# Patient Record
Sex: Female | Born: 1990
Health system: Southern US, Community
[De-identification: ages and names within clinical notes are randomized; demographics above are authoritative.]

## PROBLEM LIST (undated history)

## (undated) ENCOUNTER — Inpatient Hospital Stay (HOSPITAL_COMMUNITY): Payer: Self-pay

## (undated) DIAGNOSIS — O1420 HELLP syndrome (HELLP), unspecified trimester: Secondary | ICD-10-CM

## (undated) DIAGNOSIS — R51 Headache: Secondary | ICD-10-CM

## (undated) DIAGNOSIS — Q512 Other doubling of uterus, unspecified: Secondary | ICD-10-CM

## (undated) DIAGNOSIS — J45909 Unspecified asthma, uncomplicated: Secondary | ICD-10-CM

## (undated) DIAGNOSIS — Q5128 Other doubling of uterus, other specified: Secondary | ICD-10-CM

## (undated) HISTORY — DX: Unspecified asthma, uncomplicated: J45.909

## (undated) HISTORY — DX: Headache: R51

## (undated) HISTORY — PX: CYST EXCISION: SHX5701

---
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
## 2011-07-10 ENCOUNTER — Other Ambulatory Visit (HOSPITAL_COMMUNITY)
Admission: RE | Admit: 2011-07-10 | Discharge: 2011-07-10 | Disposition: A | Discharge status: Home / Self Care | Payer: BC Managed Care – PPO | Source: Ambulatory Visit | Attending: Family Medicine | Admitting: Family Medicine

## 2011-07-10 ENCOUNTER — Other Ambulatory Visit: Payer: Self-pay | Admitting: Dermatology

## 2011-07-10 DIAGNOSIS — Z124 Encounter for screening for malignant neoplasm of cervix: Secondary | ICD-10-CM | POA: Insufficient documentation

## 2013-06-14 ENCOUNTER — Other Ambulatory Visit (HOSPITAL_COMMUNITY)
Admission: RE | Admit: 2013-06-14 | Discharge: 2013-06-14 | Disposition: A | Discharge status: Home / Self Care | Payer: BC Managed Care – PPO | Source: Ambulatory Visit | Attending: Family Medicine | Admitting: Family Medicine

## 2013-06-14 ENCOUNTER — Other Ambulatory Visit: Payer: Self-pay | Admitting: Family Medicine

## 2013-06-14 DIAGNOSIS — Z124 Encounter for screening for malignant neoplasm of cervix: Secondary | ICD-10-CM | POA: Insufficient documentation

## 2014-03-10 ENCOUNTER — Encounter: Payer: Self-pay | Admitting: *Deleted

## 2014-03-10 DIAGNOSIS — R51 Headache: Secondary | ICD-10-CM

## 2014-03-10 DIAGNOSIS — R519 Headache, unspecified: Secondary | ICD-10-CM | POA: Insufficient documentation

## 2014-03-16 ENCOUNTER — Encounter: Payer: Self-pay | Admitting: Neurology

## 2014-03-16 ENCOUNTER — Ambulatory Visit (INDEPENDENT_AMBULATORY_CARE_PROVIDER_SITE_OTHER): Payer: BC Managed Care – PPO | Admitting: Neurology

## 2014-03-16 VITALS — BP 102/68 | HR 68 | Temp 98.1°F | Resp 16 | Ht 64.0 in | Wt 133.9 lb

## 2014-03-16 DIAGNOSIS — R519 Headache, unspecified: Secondary | ICD-10-CM

## 2014-03-16 DIAGNOSIS — R51 Headache: Secondary | ICD-10-CM

## 2014-03-16 DIAGNOSIS — G44209 Tension-type headache, unspecified, not intractable: Secondary | ICD-10-CM

## 2014-03-16 DIAGNOSIS — G4485 Primary stabbing headache: Secondary | ICD-10-CM

## 2014-03-16 MED ORDER — SUMATRIPTAN SUCCINATE 100 MG PO TABS: ORAL_TABLET | ORAL | Status: DC

## 2014-03-16 MED ORDER — TOPIRAMATE 25 MG PO TABS: 25.0000 mg | ORAL_TABLET | Freq: Every day | ORAL | Status: DC

## 2014-03-16 NOTE — Progress Notes (Addendum)
NEUROLOGY CONSULTATION NOTE  Brianna Robinson MRN: [id number] DOB: Feb 07, 1991  Referring provider: Dr. Hyman HopesWebb Primary care provider: Dr. Hyman HopesWebb  Reason for consult:  New onset daily headaches.  HISTORY OF PRESENT ILLNESS: Brianna Robinson is a 23 year old right-handed woman with remote history of asthma who presents for headache.  Onset:  3 months ago (March) Location:  Bifrontal, temporal, and anterior top of the head. Quality:  Brief intense sharp stabbing pain in the temples. Throbbing headache in the by temporal, frontal, retro-orbital region and top of the head. Both these headaches can occur separately. She also has fairly chronic muscle tension in her neck and shoulders. Intensity:  5/10 Aura:  No Prodrome:  No Associated symptoms:  No nausea, vomiting, photophobia, phonophobia, osmophobia, visual disturbance, or autonomic symptoms. Duration:  Stabbing pain is brief. Throbbing headaches occur up to 10 hours. She can wake up with them, they can occur during the day, where they can occur in the evening before bed. Frequency:  Daily. Triggers/exacerbating factors:  She feels that her neck and shoulder tension contributes. Otherwise, she hasn't really noticed any other triggers. Relieving factors:  No noticeable relieving factors. Sometimes if she lays supine with her head hanging off the bed, it relieves the neck tension but not the headache. Activity:  She is able to force herself to function  Past abortive therapy:  Excedrin Migraine, Excedrin tension headache, naproxen 1000 mg Past preventative therapy:  None Recently discontinued norgestimate-Eth Estradiol  Current abortive therapy:  cyclobenzaprine 10mg  (only takes at bedtime to 2 concern for sedation. Since she falls asleep she is not sure if it's effective), sumatriptan 25mg  (tried a couple of times and was ineffective), naproxen 1000mg  (ineffective. May be takes 3 times per week) Current preventative therapy:  None  Caffeine:   1 cup of coffee per day Alcohol:  No Smoker:  no Diet:  Increase hydration. Actively trying to eat well Exercise:  Good Depression/stress:  Recently graduated from college in December. She does have some anxiety about finding another job but she says that she and her husband are financially secure at this time. Sleep hygiene:  Good Family history of headache:  Mom, maternal grandmother (both had migraines)  PAST MEDICAL HISTORY: Past Medical History  Diagnosis Date  . Asthma   . Headache(784.0)     PAST SURGICAL HISTORY: Past Surgical History  Procedure Laterality Date  . Cyst excision      MEDICATIONS: Current Outpatient Prescriptions on File Prior to Visit  Medication Sig Dispense Refill  . naproxen (NAPROSYN) 250 MG tablet Take by mouth 2 (two) times daily with a meal.      . norgestimate-ethinyl estradiol (ORTHO-CYCLEN,SPRINTEC,PREVIFEM) 0.25-35 MG-MCG tablet Take 1 tablet by mouth daily.      . miconazole (MICOTIN) 2 % cream Apply 1 application topically 2 (two) times daily.       No current facility-administered medications on file prior to visit.    ALLERGIES: Allergies  Allergen Reactions  . Amoxicillin     FAMILY HISTORY: Family History  Problem Relation Age of Onset  . Ataxia Neg Hx   . Chorea Neg Hx   . Dementia Neg Hx   . Mental retardation Neg Hx   . Migraines Neg Hx   . Multiple sclerosis Neg Hx   . Neurofibromatosis Neg Hx   . Neuropathy Neg Hx   . Parkinsonism Neg Hx   . Seizures Neg Hx   . Stroke Neg Hx     SOCIAL HISTORY: History  Social History  . Marital Status: Married    Spouse Name: N/A    Number of Children: N/A  . Years of Education: N/A   Occupational History  . Not on file.   Social History Main Topics  . Smoking status: Never Smoker   . Smokeless tobacco: Not on file  . Alcohol Use: Yes     Comment: very rare   . Drug Use: No  . Sexual Activity: Yes    Partners: Male   Other Topics Concern  . Not on file    Social History Narrative  . No narrative on file    REVIEW OF SYSTEMS: Constitutional: No fevers, chills, or sweats, no generalized fatigue, change in appetite Eyes: No visual changes, double vision, eye pain Ear, nose and throat: No hearing loss, ear pain, nasal congestion, sore throat Cardiovascular: No chest pain, palpitations Respiratory:  No shortness of breath at rest or with exertion, wheezes GastrointestinaI: No nausea, vomiting, diarrhea, abdominal pain, fecal incontinence Genitourinary:  No dysuria, urinary retention or frequency Musculoskeletal:  No neck pain, back pain Integumentary: No rash, pruritus, skin lesions Neurological: as above Psychiatric: No depression, insomnia, anxiety Endocrine: No palpitations, fatigue, diaphoresis, mood swings, change in appetite, change in weight, increased thirst Hematologic/Lymphatic:  No anemia, purpura, petechiae. Allergic/Immunologic: no itchy/runny eyes, nasal congestion, recent allergic reactions, rashes  PHYSICAL EXAM: Filed Vitals:   03/16/14 0914  BP: 102/68  Pulse: 68  Temp: 98.1 F (36.7 C)  Resp: 16   General: No acute distress Head:  Normocephalic/atraumatic Neck: supple, bilateral tenderness to palpation, as well as bilateral suboccipital tenderness to palpation, full range of motion Back: No paraspinal tenderness Heart: regular rate and rhythm Lungs: Clear to auscultation bilaterally. Vascular: No carotid bruits. Neurological Exam: Mental status: alert and oriented to person, place, and time, recent and remote memory intact, fund of knowledge intact, attention and concentration intact, speech fluent and not dysarthric, language intact. Cranial nerves: CN I: not tested CN II: pupils equal, round and reactive to light, visual fields intact, fundi unremarkable, without vessel changes, exudates, hemorrhages or papilledema. CN III, IV, VI:  full range of motion, no nystagmus, no ptosis CN V: facial sensation  intact CN VII: upper and lower face symmetric CN VIII: hearing intact CN IX, X: gag intact, uvula midline CN XI: sternocleidomastoid and trapezius muscles intact CN XII: tongue midline Bulk & Tone: normal, no fasciculations. Motor: 5 out of 5 throughout Sensation: Temperature and vibration intact Deep Tendon Reflexes: 2+ throughout, toes downgoing Finger to nose testing:  No dysmetria Heel to shin: No dysmetria Gait: Normal station and stride. Able to turn and walk in tandem. Romberg negative.  IMPRESSION: Possibly primary stabbing headache with tension-type headaches. Migraine is also a possibility. Her semiology doesn't really fit any particular headache syndrome.  PLAN: 1. Will initiate Topamax 25 mg at bedtime. Side effects discussed. 2. For abortive therapy, will try sumatriptan at 100 mg. If ineffective, she will try taking it with naproxen 500 mg. 3. Limit use of all pain relievers to no more than 2 days out of the week. 4. Since this is a new daily persistent headache, will order MRI of the brain with and without contrast to rule out any structural intracranial abnormalities. 5. She will call in 4 weeks with an update and we can adjust medications if needed. 6. Followup in 3 months.  Thank you for allowing me to take part in the care of this patient.  Shon Millet, DO  CC: Okey Regal  Hyman HopesWebb, MD

## 2014-03-16 NOTE — Patient Instructions (Addendum)
  1.  Take sumatriptan 100mg  at immediate onset of migraine.  May repeat once in 2 hours if needed.  Do not exceed 2 tablets in 24 hours.  If ineffective, next time you have a headache, take it with naproxen 500mg  2.  Limit use of pain relievers to no more than 2 days out of the week.  These medications include acetaminophen, ibuprofen, triptans and narcotics.  This will help reduce risk of rebound headaches. 3.  Keep a headache diary. 4.  Stay adequately hydrated. 5.  Maintain good sleep hygiene. 6.  Maintain proper stress management. 7.  Try a cyclobenzaprine during the day if you feel the neck tension. 8.  We will start topiramate (Topamax) 25mg  at bedtime.  Possible side effects include: impaired thinking, sedation, paresthesias (numbness and tingling) and weight loss.  It may cause dehydration and there is a small risk for kidney stones, so make sure to stay hydrated with water during the day.  There is also a very small risk for glaucoma, so if you notice any change in your vision while taking this medication, see an ophthalmologist.  9.  Call in 4 weeks with update and we can adjust dose if needed. 10.  Follow up in 3 months. 11.  We will also get MRI of the brain since this is a new type of headache. [facility]  04/07/14 at 1:45pm

## 2014-04-07 ENCOUNTER — Ambulatory Visit (HOSPITAL_COMMUNITY): Payer: BC Managed Care – PPO

## 2014-04-26 ENCOUNTER — Telehealth: Payer: Self-pay | Admitting: Neurology

## 2014-04-26 NOTE — Telephone Encounter (Signed)
Returned patient's call. She states that when she saw Dr. Everlena CooperJaffe in June that they discussed possibly trying Biofeedback down the road for her. She states that she thinks she wants to go ahead and give it a try, but wanted to know what she needed to tell her insurance co in order to find out if they would cover it and how much they would cover. I advised her to give them a call and see if they cover Biofeedback Therapy and if they do what percentage do they cover or what her out of pocket would be. She states she would do that. She will get back to us if she wants to proceed.

## 2014-04-26 NOTE — Telephone Encounter (Signed)
Pt called requesting to speak to a nurse. C.B [phone]

## 2014-06-20 ENCOUNTER — Ambulatory Visit: Payer: BC Managed Care – PPO | Admitting: Neurology

## 2015-06-05 LAB — OB RESULTS CONSOLE RPR: RPR: NONREACTIVE

## 2015-06-05 LAB — OB RESULTS CONSOLE HEPATITIS B SURFACE ANTIGEN: Hepatitis B Surface Ag: NEGATIVE

## 2015-06-05 LAB — OB RESULTS CONSOLE RUBELLA ANTIBODY, IGM: RUBELLA: IMMUNE

## 2015-06-05 LAB — OB RESULTS CONSOLE ANTIBODY SCREEN: Antibody Screen: NEGATIVE

## 2015-06-05 LAB — OB RESULTS CONSOLE ABO/RH: RH Type: NEGATIVE

## 2015-06-05 LAB — OB RESULTS CONSOLE HIV ANTIBODY (ROUTINE TESTING): HIV: NONREACTIVE

## 2015-06-20 LAB — OB RESULTS CONSOLE GC/CHLAMYDIA
CHLAMYDIA, DNA PROBE: NEGATIVE
GC PROBE AMP, GENITAL: NEGATIVE

## 2015-12-21 LAB — OB RESULTS CONSOLE GBS: GBS: NEGATIVE

## 2015-12-27 ENCOUNTER — Inpatient Hospital Stay (HOSPITAL_COMMUNITY): Payer: BLUE CROSS/BLUE SHIELD

## 2015-12-27 ENCOUNTER — Inpatient Hospital Stay (HOSPITAL_COMMUNITY)
Admission: AD | Admit: 2015-12-27 | Discharge: 2015-12-31 | DRG: 765 | Disposition: A | Discharge status: Home / Self Care | Payer: BLUE CROSS/BLUE SHIELD | Source: Ambulatory Visit | Attending: Obstetrics and Gynecology | Admitting: Obstetrics and Gynecology

## 2015-12-27 ENCOUNTER — Encounter (HOSPITAL_COMMUNITY): Payer: Self-pay | Admitting: *Deleted

## 2015-12-27 DIAGNOSIS — Z3A37 37 weeks gestation of pregnancy: Secondary | ICD-10-CM

## 2015-12-27 DIAGNOSIS — O3403 Maternal care for unspecified congenital malformation of uterus, third trimester: Secondary | ICD-10-CM | POA: Diagnosis present

## 2015-12-27 DIAGNOSIS — D62 Acute posthemorrhagic anemia: Secondary | ICD-10-CM | POA: Diagnosis not present

## 2015-12-27 DIAGNOSIS — O9952 Diseases of the respiratory system complicating childbirth: Secondary | ICD-10-CM | POA: Diagnosis present

## 2015-12-27 DIAGNOSIS — K219 Gastro-esophageal reflux disease without esophagitis: Secondary | ICD-10-CM | POA: Diagnosis present

## 2015-12-27 DIAGNOSIS — J45909 Unspecified asthma, uncomplicated: Secondary | ICD-10-CM | POA: Diagnosis present

## 2015-12-27 DIAGNOSIS — O9081 Anemia of the puerperium: Secondary | ICD-10-CM | POA: Diagnosis not present

## 2015-12-27 DIAGNOSIS — R109 Unspecified abdominal pain: Secondary | ICD-10-CM

## 2015-12-27 DIAGNOSIS — O26899 Other specified pregnancy related conditions, unspecified trimester: Secondary | ICD-10-CM

## 2015-12-27 DIAGNOSIS — Q512 Other doubling of uterus: Secondary | ICD-10-CM | POA: Diagnosis not present

## 2015-12-27 DIAGNOSIS — O1424 HELLP syndrome, complicating childbirth: Principal | ICD-10-CM | POA: Diagnosis present

## 2015-12-27 DIAGNOSIS — O9962 Diseases of the digestive system complicating childbirth: Secondary | ICD-10-CM | POA: Diagnosis present

## 2015-12-27 DIAGNOSIS — D509 Iron deficiency anemia, unspecified: Secondary | ICD-10-CM | POA: Diagnosis present

## 2015-12-27 DIAGNOSIS — R1013 Epigastric pain: Secondary | ICD-10-CM | POA: Diagnosis present

## 2015-12-27 HISTORY — DX: Other doubling of uterus, unspecified: Q51.20

## 2015-12-27 HISTORY — DX: Other and unspecified doubling of uterus: Q51.28

## 2015-12-27 LAB — CBC
HCT: 31.5 % — ABNORMAL LOW (ref 36.0–46.0)
Hemoglobin: 10.9 g/dL — ABNORMAL LOW (ref 12.0–15.0)
MCH: 30.6 pg (ref 26.0–34.0)
MCHC: 34.6 g/dL (ref 30.0–36.0)
MCV: 88.5 fL (ref 78.0–100.0)
Platelets: 137 10*3/uL — ABNORMAL LOW (ref 150–400)
RBC: 3.56 MIL/uL — ABNORMAL LOW (ref 3.87–5.11)
RDW: 13.1 % (ref 11.5–15.5)
WBC: 13.9 10*3/uL — ABNORMAL HIGH (ref 4.0–10.5)

## 2015-12-27 LAB — CBC WITH DIFFERENTIAL/PLATELET
Basophils Absolute: 0 10*3/uL (ref 0.0–0.1)
Basophils Relative: 0 %
Eosinophils Absolute: 0.1 10*3/uL (ref 0.0–0.7)
Eosinophils Relative: 1 %
HCT: 31.7 % — ABNORMAL LOW (ref 36.0–46.0)
Hemoglobin: 11 g/dL — ABNORMAL LOW (ref 12.0–15.0)
Lymphocytes Relative: 15 %
Lymphs Abs: 1.8 10*3/uL (ref 0.7–4.0)
MCH: 30.7 pg (ref 26.0–34.0)
MCHC: 34.7 g/dL (ref 30.0–36.0)
MCV: 88.5 fL (ref 78.0–100.0)
Monocytes Absolute: 0.5 10*3/uL (ref 0.1–1.0)
Monocytes Relative: 4 %
Neutro Abs: 9.8 10*3/uL — ABNORMAL HIGH (ref 1.7–7.7)
Neutrophils Relative %: 80 %
Platelets: 166 10*3/uL (ref 150–400)
RBC: 3.58 MIL/uL — ABNORMAL LOW (ref 3.87–5.11)
RDW: 13.1 % (ref 11.5–15.5)
WBC: 12.2 10*3/uL — ABNORMAL HIGH (ref 4.0–10.5)

## 2015-12-27 LAB — PROTEIN / CREATININE RATIO, URINE
Creatinine, Urine: 32 mg/dL
Total Protein, Urine: <6 mg/dL

## 2015-12-27 LAB — AST: AST: 63 U/L — ABNORMAL HIGH (ref 15–41)

## 2015-12-27 LAB — COMPREHENSIVE METABOLIC PANEL
ALT: 32 U/L (ref 14–54)
AST: 44 U/L — ABNORMAL HIGH (ref 15–41)
Albumin: 2.9 g/dL — ABNORMAL LOW (ref 3.5–5.0)
Alkaline Phosphatase: 117 U/L (ref 38–126)
Anion gap: 8 (ref 5–15)
BUN: 10 mg/dL (ref 6–20)
CO2: 23 mmol/L (ref 22–32)
Calcium: 8.7 mg/dL — ABNORMAL LOW (ref 8.9–10.3)
Chloride: 103 mmol/L (ref 101–111)
Creatinine, Ser: 0.62 mg/dL (ref 0.44–1.00)
GFR calc Af Amer: >60 mL/min (ref >60)
GFR calc non Af Amer: >60 mL/min (ref >60)
Glucose, Bld: 105 mg/dL — ABNORMAL HIGH (ref 65–99)
Potassium: 3.9 mmol/L (ref 3.5–5.1)
Sodium: 134 mmol/L — ABNORMAL LOW (ref 135–145)
Total Bilirubin: 0.5 mg/dL (ref 0.3–1.2)
Total Protein: 6.1 g/dL — ABNORMAL LOW (ref 6.5–8.1)

## 2015-12-27 LAB — AMYLASE: Amylase: 57 U/L (ref 28–100)

## 2015-12-27 LAB — PLATELET COUNT: Platelets: 147 10*3/uL — ABNORMAL LOW (ref 150–400)

## 2015-12-27 LAB — URIC ACID: Uric Acid, Serum: 5.1 mg/dL (ref 2.3–6.6)

## 2015-12-27 LAB — ALT: ALT: 42 U/L (ref 14–54)

## 2015-12-27 MED ORDER — LIDOCAINE HCL (PF) 1 % IJ SOLN: 30.0000 mL | INTRAMUSCULAR | Status: DC | PRN

## 2015-12-27 MED ORDER — LACTATED RINGERS IV SOLN
INTRAVENOUS | Status: DC
  Administered 2015-12-27: 12:00:00 via INTRAVENOUS

## 2015-12-27 MED ORDER — LACTATED RINGERS IV SOLN: 500.0000 mL | INTRAVENOUS | Status: DC | PRN

## 2015-12-27 MED ORDER — NALBUPHINE HCL 10 MG/ML IJ SOLN
10.0000 mg | INTRAMUSCULAR | Status: AC
  Administered 2015-12-27: 10 mg via INTRAVENOUS
  Filled 2015-12-27: qty 1

## 2015-12-27 MED ORDER — LACTATED RINGERS IV SOLN
INTRAVENOUS | Status: DC
  Administered 2015-12-27 – 2015-12-28 (×2): via INTRAVENOUS

## 2015-12-27 MED ORDER — BUTORPHANOL TARTRATE 1 MG/ML IJ SOLN
1.0000 mg | Freq: Once | INTRAMUSCULAR | Status: AC
  Administered 2015-12-27: 1 mg via INTRAVENOUS
  Filled 2015-12-27: qty 1

## 2015-12-27 MED ORDER — BUTORPHANOL TARTRATE 1 MG/ML IJ SOLN
1.0000 mg | INTRAMUSCULAR | Status: DC | PRN
  Administered 2015-12-27: 1 mg via INTRAVENOUS
  Filled 2015-12-27: qty 1

## 2015-12-27 MED ORDER — GI COCKTAIL ~~LOC~~
30.0000 mL | Freq: Once | ORAL | Status: AC
  Administered 2015-12-27: 30 mL via ORAL
  Filled 2015-12-27 (×2): qty 30

## 2015-12-27 MED ORDER — CITRIC ACID-SODIUM CITRATE 334-500 MG/5ML PO SOLN
30.0000 mL | ORAL | Status: DC | PRN
  Administered 2015-12-28: 30 mL via ORAL
  Filled 2015-12-27: qty 15

## 2015-12-27 NOTE — MAU Note (Addendum)
Pt states pain in abd woke her this am about 0600.  Pain comes in waves but doesn't completely go away.  Pt states it does not feel like contractions.   Had a norm good BM yesterday.  Pt took stool softener this morning at 0600 thinking it may be related to constipation.  Denies vaginal bleeding.  Good fetal movement, state baby is more active than normal. Some vaginal itching on outside x 6 days.  Pt states she passed a large amount of creamy whitish discharge this morning, not like her water breaking.

## 2015-12-27 NOTE — Progress Notes (Signed)
S:  Pain persistent - initial stadol 1mg  ineffective - 2nd dose given with some relief  O:  VS: Blood pressure 122/76, pulse 67, temperature 98.2 F (36.8 C), temperature source Oral, resp. rate 18.        BP: 128/74 - 116/72 - 131/82 -122/76        FHR : baseline 140 / variability moderate / accelerations + / no decelerations        Toco: contractions irregular         Cervix : deferred at this time          A:  37.1 weeks      Abdominal pain - epigastric etiology      FHR category 1  P: repeat labs in next hour     Management decision based on lab results   Marlinda MikeBAILEY, Angeliyah Kirkey CNM, MSN, [facility] 12/27/2015, 11:14 PM

## 2015-12-27 NOTE — MAU Note (Signed)
Came in from office, severe abd pain this morning. No bleeding or leaking.  Some nausea, denies fever, vomiting or diarrhea

## 2015-12-27 NOTE — MAU Provider Note (Signed)
History     CSN: [id number]  Arrival date and time: 12/27/15 1114 Sent from office for labs / Estrella Myrtle from nurse @ [phone] Provider on unit @ 1700     Chief Complaint  Patient presents with  . Abdominal Pain   HPI  Abdominal pain - constant with worsening intense pains  Past Medical History  Diagnosis Date  . Asthma   . Headache(784.0)   . Septate uterus     Past Surgical History  Procedure Laterality Date  . Cyst excision      Family History  Problem Relation Age of Onset  . Ataxia Neg Hx   . Chorea Neg Hx   . Dementia Neg Hx   . Mental retardation Neg Hx   . Migraines Neg Hx   . Multiple sclerosis Neg Hx   . Neurofibromatosis Neg Hx   . Neuropathy Neg Hx   . Parkinsonism Neg Hx   . Seizures Neg Hx   . Stroke Neg Hx     Social History  Substance Use Topics  . Smoking status: Never Smoker   . Smokeless tobacco: None  . Alcohol Use: Yes     Comment: very rare     Allergies:  Allergies  Allergen Reactions  . Amoxicillin Nausea And Vomiting    Has patient had a PCN reaction causing immediate rash, facial/tongue/throat swelling, SOB or lightheadedness with hypotension: No Has patient had a PCN reaction causing severe rash involving mucus membranes or skin necrosis: No Has patient had a PCN reaction that required hospitalization No Has patient had a PCN reaction occurring within the last 10 years: No If all of the above answers are "NO", then may proceed with Cephalosporin use.    Prescriptions prior to admission  Medication Sig Dispense Refill Last Dose  . calcium carbonate (TUMS - DOSED IN MG ELEMENTAL CALCIUM) 500 MG chewable tablet Chew 1 tablet by mouth daily.   12/27/2015 at Unknown time  . docusate sodium (COLACE) 100 MG capsule Take 100 mg by mouth daily.   12/27/2015 at Unknown time  . ferrous sulfate 325 (65 FE) MG tablet Take 325 mg by mouth daily with breakfast.   12/26/2015 at Unknown time  . Prenatal Vit-Fe Fumarate-FA (PRENATAL MULTIVITAMIN) TABS  tablet Take 1 tablet by mouth daily at 12 noon.   12/26/2015 at Unknown time  . SUMAtriptan (IMITREX) 100 MG tablet Take 1 tab at earliest onset of headache.  May repeat in 2 hours once if headache persists or recurs.  Do not exceed 2 tablets in 24 hours. (Patient not taking: Reported on 12/27/2015) 9 tablet 0   . topiramate (TOPAMAX) 25 MG tablet Take 1 tablet (25 mg total) by mouth at bedtime. (Patient not taking: Reported on 12/27/2015) 30 tablet 0     ROS  Abdominal pain - mostly upper abdomen radiates down Mild nausea -no vomiting Pain is constant with intermittent intense pain No headache or vision changes Bowel movement yesterday - normal Normal appetite No fever or chills  Physical Exam   Blood pressure 140/81, pulse 69, temperature 97.9 F (36.6 C), temperature source Oral, resp. rate 20.  Physical Exam Alert and oriented / NAD / pale with flushed face Abdomen soft, mildly tender upper quadrants Uterus gravid and non-tender Defer pelvic exam (cervix closed in office)  Results for MARYFRANCES, PORTUGAL (MRN [id number]) as of 12/27/2015 17:07  Ref. Range 12/27/2015 11:58  Sodium Latest Ref Range: 135-145 mmol/L 134 (L)  Potassium Latest Ref Range: 3.5-5.1 mmol/L 3.9  Chloride Latest Ref Range: 101-111 mmol/L 103  CO2 Latest Ref Range: 22-32 mmol/L 23  BUN Latest Ref Range: 6-20 mg/dL 10  Creatinine Latest Ref Range: 0.44-1.00 mg/dL 0.62  Calcium Latest Ref Range: 8.9-10.3 mg/dL 8.7 (L)  EGFR (Non-African Amer.) Latest Ref Range: >60 mL/min >60  EGFR (African American) Latest Ref Range: >60 mL/min >60  Glucose Latest Ref Range: 65-99 mg/dL 105 (H)  Anion gap Latest Ref Range: 5-15  8  Alkaline Phosphatase Latest Ref Range: 38-126 U/L 117  Albumin Latest Ref Range: 3.5-5.0 g/dL 2.9 (L)  Uric Acid, Serum Latest Ref Range: 2.3-6.6 mg/dL 5.1  AST Latest Ref Range: 15-41 U/L 44 (H)  ALT Latest Ref Range: 14-54 U/L 32  Total Protein Latest Ref Range: 6.5-8.1 g/dL 6.1 (L)  Total Bilirubin  Latest Ref Range: 0.3-1.2 mg/dL 0.5  WBC Latest Ref Range: 4.0-10.5 K/uL 12.2 (H)  RBC Latest Ref Range: 3.87-5.11 MIL/uL 3.58 (L)  Hemoglobin Latest Ref Range: 12.0-15.0 g/dL 11.0 (L)  HCT Latest Ref Range: 36.0-46.0 % 31.7 (L)  MCV Latest Ref Range: 78.0-100.0 fL 88.5  MCH Latest Ref Range: 26.0-34.0 pg 30.7  MCHC Latest Ref Range: 30.0-36.0 g/dL 34.7  RDW Latest Ref Range: 11.5-15.5 % 13.1  Platelets Latest Ref Range: 150-400 K/uL 166    MAU Course  Procedures Limited sono - vtx / AFI 11 / normal placenta / normal ovaries noted   Assessment and Plan  37.1 weeks Abdominal pain - epigastric etiology versus gas pain related to constipation Mildly elevated LE (44/32)  and decreasing platelet count (219 down to 166) No evidence of labor No evidence of appendix etiology  Artelia Laroche 12/27/2015, 5:09 PM    12/27/2015 @ 1820 - labs reviewed & phone consult to Dr Ronita Hipps     Results for ELEASHA, CATALDO (MRN [id number]) as of 12/27/2015 18:16  Ref. Range 12/27/2015 17:14  Amylase, Serum Latest Ref Range: 28-100 U/L 57  AST Latest Ref Range: 15-41 U/L 63 (H)  ALT Latest Ref Range: 14-54 U/L 42   Assessment: 37 weeks abdominal pain with increasing LE  Plan: Admit            Clear liquid diet             Repeat labs in 6 hour intervals              If LE increase again - initiate IOL protocol for HELLP  Artelia Laroche CNM [facility]

## 2015-12-27 NOTE — H&P (Signed)
  OB ADMISSION/ HISTORY & PHYSICAL:  Admission Date: 12/27/2015 11:14 AM  Admit Diagnosis: 37.2 weeks / abdominal pain - epigastric pain /   Brianna Robinson is a 25 y.o. female presenting for severe abdominal pain onset 0630am. No PIH symptoms other than epigastric pain. No evidence of labor.  Prenatal History: G1P0   EDC: 01/16/2016 by Other Basis  [facility]  Primary Ob Provider: Kathi LudwigBailey CNM Prenatal course complicated by septate uterus / IDA of pregnancy  Prenatal Labs: ABO, Rh:  A negative Antibody:  negative Rubella:   Immune RPR:   NR HBsAg:   negative HIV:   NR GTT: NL GBS:   negative  Medical / Surgical History :  Past medical history:  Past Medical History  Diagnosis Date  . Asthma   . Headache(784.0)   . Septate uterus     Past surgical history:  Past Surgical History  Procedure Laterality Date  . Cyst excision     Family History:  Family History  Problem Relation Age of Onset  . Ataxia Neg Hx   . Chorea Neg Hx   . Dementia Neg Hx   . Mental retardation Neg Hx   . Migraines Neg Hx   . Multiple sclerosis Neg Hx   . Neurofibromatosis Neg Hx   . Neuropathy Neg Hx   . Parkinsonism Neg Hx   . Seizures Neg Hx   . Stroke Neg Hx      Social History:  reports that she has never smoked. She does not have any smokeless tobacco history on file. She reports that she drinks alcohol. She reports that she does not use illicit drugs.   Allergies: Amoxicillin   Current Medications at time of admission:  Prior to Admission medications   Medication Sig Start Date End Date Taking? Authorizing Provider  calcium carbonate (TUMS - DOSED IN MG ELEMENTAL CALCIUM) 500 MG chewable tablet Chew 1 tablet by mouth daily.   Yes Historical Provider, MD  docusate sodium (COLACE) 100 MG capsule Take 100 mg by mouth daily.   Yes Historical Provider, MD  ferrous sulfate 325 (65 FE) MG tablet Take 325 mg by mouth daily with breakfast.   Yes Historical  Provider, MD  Prenatal Vit-Fe Fumarate-FA (PRENATAL MULTIVITAMIN) TABS tablet Take 1 tablet by mouth daily at 12 noon.   Yes Historical Provider, MD   Review of Systems: Active FM Some ctx Awoke to abdominal pain this am  Physical Exam:  VS: Blood pressure 125/74, pulse 61, temperature 97.9 F (36.6 C), temperature source Oral, resp. rate 20.  General: alert and oriented, appears uncomfortable in pain Heart: RRR Lungs: Clear lung fields Abdomen: Gravid, soft and mildly tender upper abdomen, non-distended / uterus: gravid  Extremities: no edema  Genitalia / VE:  FT / 50% / vtx -3  FHR: baseline rate 140 / variability moderate / accelerations + / no decelerations TOCO: rare irregular ctx  Assessment: [redacted] weeks gestation Abdominal pain - epigastric location Suspect HELLP etiology with elevated LE and decreasing platelet FHR category 1   Plan:  Admit  Dr Billy Coastaavon notified of admission / plan of care - admit GI cocktail - repeat labs Q 6 hours - consider IOL with worsening labs   Marlinda MikeBAILEY, Brianna Robinson CNM, MSN, [facility] 12/27/2015, 6:35 PM

## 2015-12-28 ENCOUNTER — Inpatient Hospital Stay (HOSPITAL_COMMUNITY): Payer: BLUE CROSS/BLUE SHIELD | Admitting: Anesthesiology

## 2015-12-28 ENCOUNTER — Encounter (HOSPITAL_COMMUNITY): Payer: Self-pay | Admitting: Certified Registered Nurse Anesthetist

## 2015-12-28 ENCOUNTER — Encounter (HOSPITAL_COMMUNITY): Payer: Self-pay | Admitting: Anesthesiology

## 2015-12-28 ENCOUNTER — Encounter (HOSPITAL_COMMUNITY)
Admission: AD | Disposition: A | Discharge status: Home / Self Care | Payer: Self-pay | Source: Ambulatory Visit | Attending: Obstetrics and Gynecology

## 2015-12-28 LAB — CBC
HCT: 32.1 % — ABNORMAL LOW (ref 36.0–46.0)
HCT: 32 % — ABNORMAL LOW (ref 36.0–46.0)
HEMOGLOBIN: 11.1 g/dL — AB (ref 12.0–15.0)
Hemoglobin: 11.1 g/dL — ABNORMAL LOW (ref 12.0–15.0)
MCH: 30.5 pg (ref 26.0–34.0)
MCH: 30.8 pg (ref 26.0–34.0)
MCHC: 34.6 g/dL (ref 30.0–36.0)
MCHC: 34.7 g/dL (ref 30.0–36.0)
MCV: 88.2 fL (ref 78.0–100.0)
MCV: 88.9 fL (ref 78.0–100.0)
PLATELETS: 119 10*3/uL — AB (ref 150–400)
Platelets: 98 10*3/uL — ABNORMAL LOW (ref 150–400)
RBC: 3.64 MIL/uL — ABNORMAL LOW (ref 3.87–5.11)
RBC: 3.6 MIL/uL — ABNORMAL LOW (ref 3.87–5.11)
RDW: 13.2 % (ref 11.5–15.5)
RDW: 13.2 % (ref 11.5–15.5)
WBC: 11.6 10*3/uL — ABNORMAL HIGH (ref 4.0–10.5)
WBC: 13.7 10*3/uL — ABNORMAL HIGH (ref 4.0–10.5)

## 2015-12-28 LAB — COMPREHENSIVE METABOLIC PANEL
ALT: 102 U/L — ABNORMAL HIGH (ref 14–54)
ALT: 71 U/L — ABNORMAL HIGH (ref 14–54)
AST: 103 U/L — ABNORMAL HIGH (ref 15–41)
AST: 160 U/L — ABNORMAL HIGH (ref 15–41)
Albumin: 2.9 g/dL — ABNORMAL LOW (ref 3.5–5.0)
Albumin: 3.1 g/dL — ABNORMAL LOW (ref 3.5–5.0)
Alkaline Phosphatase: 111 U/L (ref 38–126)
Alkaline Phosphatase: 114 U/L (ref 38–126)
Anion gap: 6 (ref 5–15)
Anion gap: 7 (ref 5–15)
BUN: 7 mg/dL (ref 6–20)
BUN: 8 mg/dL (ref 6–20)
CO2: 23 mmol/L (ref 22–32)
CO2: 24 mmol/L (ref 22–32)
Calcium: 8 mg/dL — ABNORMAL LOW (ref 8.9–10.3)
Calcium: 8.5 mg/dL — ABNORMAL LOW (ref 8.9–10.3)
Chloride: 106 mmol/L (ref 101–111)
Chloride: 108 mmol/L (ref 101–111)
Creatinine, Ser: 0.53 mg/dL (ref 0.44–1.00)
Creatinine, Ser: 0.63 mg/dL (ref 0.44–1.00)
GFR calc Af Amer: >60 mL/min (ref >60)
GFR calc Af Amer: >60 mL/min (ref >60)
GFR calc non Af Amer: >60 mL/min (ref >60)
GFR calc non Af Amer: >60 mL/min (ref >60)
Glucose, Bld: 82 mg/dL (ref 65–99)
Glucose, Bld: 98 mg/dL (ref 65–99)
Potassium: 3.8 mmol/L (ref 3.5–5.1)
Potassium: 4 mmol/L (ref 3.5–5.1)
Sodium: 136 mmol/L (ref 135–145)
Sodium: 138 mmol/L (ref 135–145)
Total Bilirubin: 0.3 mg/dL (ref 0.3–1.2)
Total Bilirubin: 0.5 mg/dL (ref 0.3–1.2)
Total Protein: 6 g/dL — ABNORMAL LOW (ref 6.5–8.1)
Total Protein: 6.1 g/dL — ABNORMAL LOW (ref 6.5–8.1)

## 2015-12-28 LAB — LIPASE, BLOOD: Lipase: 16 U/L (ref 11–51)

## 2015-12-28 LAB — URIC ACID: Uric Acid, Serum: 4.7 mg/dL (ref 2.3–6.6)

## 2015-12-28 SURGERY — Surgical Case
Anesthesia: Epidural

## 2015-12-28 SURGERY — Surgical Case
Anesthesia: Regional

## 2015-12-28 MED ORDER — OXYTOCIN 10 UNIT/ML IJ SOLN
INTRAMUSCULAR | Status: AC
  Filled 2015-12-28: qty 4

## 2015-12-28 MED ORDER — CEFAZOLIN SODIUM-DEXTROSE 2-3 GM-% IV SOLR
INTRAVENOUS | Status: DC | PRN
  Administered 2015-12-28: 2 g via INTRAVENOUS

## 2015-12-28 MED ORDER — BUPIVACAINE HCL (PF) 0.25 % IJ SOLN
INTRAMUSCULAR | Status: AC
  Filled 2015-12-28: qty 30

## 2015-12-28 MED ORDER — MEPERIDINE HCL 25 MG/ML IJ SOLN
INTRAMUSCULAR | Status: AC
  Filled 2015-12-28: qty 1

## 2015-12-28 MED ORDER — MORPHINE SULFATE (PF) 0.5 MG/ML IJ SOLN
INTRAMUSCULAR | Status: AC
  Filled 2015-12-28: qty 10

## 2015-12-28 MED ORDER — NALBUPHINE HCL 10 MG/ML IJ SOLN: 5.0000 mg | Freq: Once | INTRAMUSCULAR | Status: DC | PRN

## 2015-12-28 MED ORDER — MAGNESIUM SULFATE 50 % IJ SOLN
2.0000 g/h | INTRAVENOUS | Status: DC
  Administered 2015-12-28 (×2): 2 g/h via INTRAVENOUS
  Filled 2015-12-28 (×2): qty 80

## 2015-12-28 MED ORDER — FENTANYL CITRATE (PF) 100 MCG/2ML IJ SOLN
INTRAMUSCULAR | Status: AC
  Filled 2015-12-28: qty 2

## 2015-12-28 MED ORDER — BUTORPHANOL TARTRATE 1 MG/ML IJ SOLN
2.0000 mg | INTRAMUSCULAR | Status: DC | PRN
  Administered 2015-12-28: 2 mg via INTRAVENOUS
  Filled 2015-12-28: qty 2

## 2015-12-28 MED ORDER — NALBUPHINE HCL 10 MG/ML IJ SOLN: 5.0000 mg | INTRAMUSCULAR | Status: DC | PRN

## 2015-12-28 MED ORDER — NALOXONE HCL 0.4 MG/ML IJ SOLN: 0.4000 mg | INTRAMUSCULAR | Status: DC | PRN

## 2015-12-28 MED ORDER — MISOPROSTOL 25 MCG QUARTER TABLET
25.0000 ug | ORAL_TABLET | ORAL | Status: DC
  Administered 2015-12-28: 25 ug via VAGINAL
  Filled 2015-12-28 (×4): qty 1
  Filled 2015-12-28: qty 0.25
  Filled 2015-12-28 (×3): qty 1
  Filled 2015-12-28: qty 0.25

## 2015-12-28 MED ORDER — SCOPOLAMINE 1 MG/3DAYS TD PT72
MEDICATED_PATCH | TRANSDERMAL | Status: AC
  Filled 2015-12-28: qty 1

## 2015-12-28 MED ORDER — PHENYLEPHRINE HCL 10 MG/ML IJ SOLN
INTRAMUSCULAR | Status: DC | PRN
  Administered 2015-12-28 (×2): 40 ug via INTRAVENOUS

## 2015-12-28 MED ORDER — DEXAMETHASONE SODIUM PHOSPHATE 4 MG/ML IJ SOLN
INTRAMUSCULAR | Status: AC
  Filled 2015-12-28: qty 1

## 2015-12-28 MED ORDER — CEFAZOLIN SODIUM-DEXTROSE 2-4 GM/100ML-% IV SOLN
2.0000 g | INTRAVENOUS | Status: DC
  Filled 2015-12-28: qty 100

## 2015-12-28 MED ORDER — LIDOCAINE-EPINEPHRINE (PF) 2 %-1:200000 IJ SOLN
INTRAMUSCULAR | Status: AC
  Filled 2015-12-28: qty 20

## 2015-12-28 MED ORDER — MAGNESIUM SULFATE 40 G IN LACTATED RINGERS - SIMPLE
INTRAVENOUS | Status: DC | PRN
  Administered 2015-12-28: 2 g/h via INTRAVENOUS

## 2015-12-28 MED ORDER — PHENYLEPHRINE 40 MCG/ML (10ML) SYRINGE FOR IV PUSH (FOR BLOOD PRESSURE SUPPORT)
80.0000 ug | PREFILLED_SYRINGE | INTRAVENOUS | Status: DC | PRN
  Filled 2015-12-28: qty 20

## 2015-12-28 MED ORDER — CEFAZOLIN SODIUM-DEXTROSE 2-4 GM/100ML-% IV SOLN
INTRAVENOUS | Status: AC
  Filled 2015-12-28: qty 100

## 2015-12-28 MED ORDER — DIPHENHYDRAMINE HCL 50 MG/ML IJ SOLN: 12.5000 mg | INTRAMUSCULAR | Status: DC | PRN

## 2015-12-28 MED ORDER — ONDANSETRON HCL 4 MG/2ML IJ SOLN: 4.0000 mg | Freq: Once | INTRAMUSCULAR | Status: DC | PRN

## 2015-12-28 MED ORDER — MEPERIDINE HCL 25 MG/ML IJ SOLN
6.2500 mg | INTRAMUSCULAR | Status: DC | PRN
Start: 2015-12-28 — End: 2015-12-28

## 2015-12-28 MED ORDER — MAGNESIUM SULFATE BOLUS VIA INFUSION
4.0000 g | Freq: Once | INTRAVENOUS | Status: AC
  Administered 2015-12-28: 4 g via INTRAVENOUS
  Filled 2015-12-28: qty 500

## 2015-12-28 MED ORDER — SCOPOLAMINE 1 MG/3DAYS TD PT72: 1.0000 | MEDICATED_PATCH | Freq: Once | TRANSDERMAL | Status: DC

## 2015-12-28 MED ORDER — LACTATED RINGERS IV SOLN
INTRAVENOUS | Status: DC | PRN
  Administered 2015-12-28: 09:00:00 via INTRAVENOUS

## 2015-12-28 MED ORDER — ONDANSETRON HCL 4 MG/2ML IJ SOLN
INTRAMUSCULAR | Status: AC
  Filled 2015-12-28: qty 2

## 2015-12-28 MED ORDER — PHENYLEPHRINE 8 MG IN D5W 100 ML (0.08MG/ML) PREMIX OPTIME
INJECTION | INTRAVENOUS | Status: AC
  Filled 2015-12-28: qty 100

## 2015-12-28 MED ORDER — SCOPOLAMINE 1 MG/3DAYS TD PT72
MEDICATED_PATCH | TRANSDERMAL | Status: DC | PRN
  Administered 2015-12-28: 1 via TRANSDERMAL

## 2015-12-28 MED ORDER — ONDANSETRON HCL 4 MG/2ML IJ SOLN: 4.0000 mg | Freq: Three times a day (TID) | INTRAMUSCULAR | Status: DC | PRN

## 2015-12-28 MED ORDER — ONDANSETRON HCL 4 MG/2ML IJ SOLN
INTRAMUSCULAR | Status: DC | PRN
  Administered 2015-12-28: 4 mg via INTRAVENOUS

## 2015-12-28 MED ORDER — SODIUM BICARBONATE 8.4 % IV SOLN
INTRAVENOUS | Status: DC | PRN
  Administered 2015-12-28 (×4): 5 mL via EPIDURAL

## 2015-12-28 MED ORDER — MEPERIDINE HCL 25 MG/ML IJ SOLN: 6.2500 mg | INTRAMUSCULAR | Status: DC | PRN

## 2015-12-28 MED ORDER — DIPHENHYDRAMINE HCL 25 MG PO CAPS
25.0000 mg | ORAL_CAPSULE | ORAL | Status: DC | PRN
  Filled 2015-12-28: qty 1

## 2015-12-28 MED ORDER — NALOXONE HCL 2 MG/2ML IJ SOSY
1.0000 ug/kg/h | PREFILLED_SYRINGE | INTRAVENOUS | Status: DC | PRN
  Filled 2015-12-28: qty 2

## 2015-12-28 MED ORDER — PHENYLEPHRINE 8 MG IN D5W 100 ML (0.08MG/ML) PREMIX OPTIME
INJECTION | INTRAVENOUS | Status: DC | PRN
  Administered 2015-12-28: 30 ug/min via INTRAVENOUS

## 2015-12-28 MED ORDER — SODIUM CHLORIDE 0.9% FLUSH: 3.0000 mL | INTRAVENOUS | Status: DC | PRN

## 2015-12-28 MED ORDER — PHENYLEPHRINE 40 MCG/ML (10ML) SYRINGE FOR IV PUSH (FOR BLOOD PRESSURE SUPPORT)
PREFILLED_SYRINGE | INTRAVENOUS | Status: AC
  Filled 2015-12-28: qty 10

## 2015-12-28 MED ORDER — MORPHINE SULFATE (PF) 0.5 MG/ML IJ SOLN
INTRAMUSCULAR | Status: DC | PRN
  Administered 2015-12-28: 3.5 mg via EPIDURAL

## 2015-12-28 MED ORDER — OXYTOCIN 10 UNIT/ML IJ SOLN
40.0000 [IU] | INTRAVENOUS | Status: DC | PRN
  Administered 2015-12-28: 40 [IU] via INTRAVENOUS

## 2015-12-28 MED ORDER — MEPERIDINE HCL 25 MG/ML IJ SOLN
INTRAMUSCULAR | Status: DC | PRN
  Administered 2015-12-28 (×2): 12.5 mg via INTRAVENOUS

## 2015-12-28 MED ORDER — BUPIVACAINE HCL (PF) 0.25 % IJ SOLN
INTRAMUSCULAR | Status: DC | PRN
  Administered 2015-12-28: 30 mL

## 2015-12-28 MED ORDER — SODIUM BICARBONATE 8.4 % IV SOLN
INTRAVENOUS | Status: AC
  Filled 2015-12-28: qty 50

## 2015-12-28 MED ORDER — FENTANYL CITRATE (PF) 100 MCG/2ML IJ SOLN
INTRAMUSCULAR | Status: DC | PRN
  Administered 2015-12-28: 100 ug via INTRAVENOUS

## 2015-12-28 MED ORDER — FENTANYL 2.5 MCG/ML BUPIVACAINE 1/10 % EPIDURAL INFUSION (WH - ANES)
12.5000 mL/h | INTRAMUSCULAR | Status: DC | PRN
  Filled 2015-12-28: qty 125

## 2015-12-28 MED ORDER — DEXAMETHASONE SODIUM PHOSPHATE 4 MG/ML IJ SOLN
INTRAMUSCULAR | Status: DC | PRN
  Administered 2015-12-28: 4 mg via INTRAVENOUS

## 2015-12-28 SURGICAL SUPPLY — 42 items
APL SKNCLS STERI-STRIP NONHPOA (GAUZE/BANDAGES/DRESSINGS) ×1
BENZOIN TINCTURE PRP APPL 2/3 (GAUZE/BANDAGES/DRESSINGS) ×1 IMPLANT
CHLORAPREP W/TINT 26ML (MISCELLANEOUS) ×2 IMPLANT
CLAMP CORD UMBIL (MISCELLANEOUS) IMPLANT
CLOTH BEACON ORANGE TIMEOUT ST (SAFETY) ×2 IMPLANT
CONTAINER PREFILL 10% NBF 15ML (MISCELLANEOUS) IMPLANT
DRSG OPSITE POSTOP 4X10 (GAUZE/BANDAGES/DRESSINGS) ×2 IMPLANT
ELECT REM PT RETURN 9FT ADLT (ELECTROSURGICAL) ×2
ELECTRODE REM PT RTRN 9FT ADLT (ELECTROSURGICAL) ×1 IMPLANT
EXTRACTOR VACUUM M CUP 4 TUBE (SUCTIONS) IMPLANT
GAUZE SPONGE 4X4 16PLY XRAY LF (GAUZE/BANDAGES/DRESSINGS) ×2 IMPLANT
GLOVE BIO SURGEON STRL SZ7.5 (GLOVE) ×2 IMPLANT
GLOVE BIOGEL PI IND STRL 7.0 (GLOVE) ×2 IMPLANT
GLOVE BIOGEL PI INDICATOR 7.0 (GLOVE) ×2
GLOVE ECLIPSE 6.5 STRL STRAW (GLOVE) ×1 IMPLANT
GLOVE INDICATOR 7.0 STRL GRN (GLOVE) ×2 IMPLANT
GOWN STRL REUS W/TWL LRG LVL3 (GOWN DISPOSABLE) ×6 IMPLANT
KIT ABG SYR 3ML LUER SLIP (SYRINGE) IMPLANT
NDL HYPO 25X5/8 SAFETYGLIDE (NEEDLE) IMPLANT
NDL SPNL 20GX3.5 QUINCKE YW (NEEDLE) IMPLANT
NEEDLE HYPO 22GX1.5 SAFETY (NEEDLE) ×2 IMPLANT
NEEDLE HYPO 25X5/8 SAFETYGLIDE (NEEDLE) IMPLANT
NEEDLE SPNL 20GX3.5 QUINCKE YW (NEEDLE) IMPLANT
NS IRRIG 1000ML POUR BTL (IV SOLUTION) ×2 IMPLANT
PACK C SECTION WH (CUSTOM PROCEDURE TRAY) ×2 IMPLANT
PAD ABD 8X7 1/2 STERILE (GAUZE/BANDAGES/DRESSINGS) ×2 IMPLANT
PENCIL SMOKE EVAC W/HOLSTER (ELECTROSURGICAL) ×2 IMPLANT
SPONGE GAUZE 4X4 12PLY (GAUZE/BANDAGES/DRESSINGS) ×1 IMPLANT
STRIP CLOSURE SKIN 1/2X4 (GAUZE/BANDAGES/DRESSINGS) ×1 IMPLANT
SUT MNCRL 0 VIOLET CTX 36 (SUTURE) ×2 IMPLANT
SUT MNCRL AB 3-0 PS2 27 (SUTURE) IMPLANT
SUT MON AB 2-0 CT1 27 (SUTURE) ×2 IMPLANT
SUT MON AB-0 CT1 36 (SUTURE) ×4 IMPLANT
SUT MONOCRYL 0 CTX 36 (SUTURE) ×2
SUT PLAIN 0 NONE (SUTURE) ×1 IMPLANT
SUT PLAIN 2 0 (SUTURE)
SUT PLAIN 2 0 XLH (SUTURE) IMPLANT
SUT PLAIN ABS 2-0 CT1 27XMFL (SUTURE) IMPLANT
SYR 20CC LL (SYRINGE) IMPLANT
SYR CONTROL 10ML LL (SYRINGE) ×2 IMPLANT
TOWEL OR 17X24 6PK STRL BLUE (TOWEL DISPOSABLE) ×2 IMPLANT
TRAY FOLEY CATH SILVER 14FR (SET/KITS/TRAYS/PACK) ×2 IMPLANT

## 2015-12-28 NOTE — Anesthesia Postprocedure Evaluation (Signed)
Anesthesia Post Note  Patient: Brianna Robinson  Procedure(s) Performed: Procedure(s) (LRB): CESAREAN SECTION (N/A)  Patient location during evaluation: Antenatal Anesthesia Type: Epidural Level of consciousness: awake and alert Pain management: pain level controlled Vital Signs Assessment: post-procedure vital signs reviewed and stable Respiratory status: spontaneous breathing Cardiovascular status: stable Postop Assessment: no signs of nausea or vomiting, adequate PO intake and patient able to bend at knees Anesthetic complications: no    Last Vitals:  Filed Vitals:   12/28/15 1420 12/28/15 1421  BP:  117/68  Pulse: 71 72  Temp:    Resp:  16    Last Pain:  Filed Vitals:   12/28/15 1426  PainSc: 2                  Evadna Donaghy Hristova

## 2015-12-28 NOTE — Lactation Note (Signed)
This note was copied from a baby's chart. Lactation Consultation Note Initial visit at 11 hours of age.  Mom reports a few attempts but baby has not latched well.  Mom has been doing some STS with baby today.  Mom is recovering from c/s due to HELLP.  Lc assisted with hand expression of a few drops and applied to baby's mouth with latch attempt.  LC fully assisted with latch, baby did not open mouth and show interest to latch and layed STS with mom as lC hand expressed 4mls to spoon feed baby.  Baby was not eager but tolerated well.  Mom was shown how to use DEBP & how to disassemble, clean, & reassemble parts. FOB at bedside supportive and helping hold pump parts while mom pumped.  LC hand expressed 10mls after pumping, mostly from right breast.  Plan mom to wake baby or feed with cues about every 3 hours.  Mom to attempt latch and offer spoon feeding (5mls) if baby doesn't latch. Mom to post pump and hand express to collect for next feeding.  Mom to pump 8X/24 hours or every 3 hours.  Mom is eager to breastfeed and is taking notes on phone to help her remember.  LC encouraged mom to ask RN for assist as needed.  [facility] resources given and discussed.  Encouraged to feed with early cues on demand.  Early newborn behavior discussed.  Hand expression demonstrated by mom with colostrum visible.  Mom to call for assist as needed.  Report given to [employer] RN, Chyrl CivatteJoann.      Patient Name: Brianna Robinson [id number]'s Date: 12/28/2015 Reason for consult: Initial assessment   Maternal Data Has patient been taught Hand Expression?: Yes Does the patient have breastfeeding experience prior to this delivery?: No  Feeding Feeding Type: Breast Fed  LATCH Score/Interventions Latch: Repeated attempts needed to sustain latch, nipple held in mouth throughout feeding, stimulation needed to elicit sucking reflex. Intervention(s): Adjust position;Assist with latch;Breast massage;Breast compression  Audible Swallowing:  None  Type of Nipple: Everted at rest and after stimulation  Comfort (Breast/Nipple): Soft / non-tender     Hold (Positioning): Full assist, staff holds infant at breast Intervention(s): Breastfeeding basics reviewed;Support Pillows;Position options;Skin to skin  LATCH Score: 5  Lactation Tools Discussed/Used WIC Program: No Pump Review: Setup, frequency, and cleaning;Milk Storage Initiated by:: JS Date initiated:: 12/28/15   Consult Status Consult Status: Follow-up Date: 12/29/15 Follow-up type: In-patient    Shoptaw, Arvella MerlesJana Lynn 12/28/2015, 9:16 PM

## 2015-12-28 NOTE — Transfer of Care (Signed)
Immediate Anesthesia Transfer of Care Note  Patient: Brianna Robinson  Procedure(s) Performed: Procedure(s): CESAREAN SECTION (N/A)  Patient Location: PACU  Anesthesia Type:Epidural  Level of Consciousness: awake, alert , oriented and patient cooperative  Airway & Oxygen Therapy: Patient Spontanous Breathing  Post-op Assessment: Report given to RN and Post -op Vital signs reviewed and stable  Post vital signs: Reviewed and stable  Last Vitals:  Filed Vitals:   12/28/15 0800 12/28/15 0822  BP: 137/89 133/91  Pulse: 66 69  Temp:  36.8 C  Resp: 16 24    Complications: No apparent anesthesia complications

## 2015-12-28 NOTE — Op Note (Signed)
Cesarean Section Procedure Note  Indications: severe preeclampsia  Worsening HELLP syndrome with severe epigastric pain Unfavorable cervix Declines attempt at VD  Pre-operative Diagnosis: 37 week 2 day pregnancy.  Post-operative Diagnosis: same  Surgeon: Lenoard AdenAAVON,Afrah Burlison J   Assistants: Fredric MareBailey, CNM  Anesthesia: Epidural and local  ASA Class: 2  Procedure Details  The patient was seen in the Holding Room. The risks, benefits, complications, treatment options, and expected outcomes were discussed with the patient.  The patient concurred with the proposed plan, giving informed consent. The risks of anesthesia, infection, bleeding and possible injury to other organs discussed. Injury to bowel, bladder, or ureter with possible need for repair discussed. Possible need for transfusion with secondary risks of hepatitis or HIV acquisition discussed. Post operative complications to include but not limited to DVT, PE and Pneumonia noted. The site of surgery properly noted/marked. The patient was taken to Operating Room # 9, identified as Joyce Copaara Parkinson and the procedure verified as C-Section Delivery. A Time Out was held and the above information confirmed.  After induction of anesthesia, the patient was draped and prepped in the usual sterile manner. A Pfannenstiel incision was made and carried down through the subcutaneous tissue to the fascia. Fascial incision was made and extended transversely using the Bovie. The fascia was separated from the underlying rectus tissue superiorly and inferiorly. The peritoneum was identified and entered. Peritoneal incision was extended longitudinally. The utero-vesical peritoneal reflection was incised transversely and the bladder flap was sharply freed from the lower uterine segment. A low transverse uterine incision(Kerr hysterotomy) was made. Delivered from OA presentation was a  female with Apgar scores of 8 at one minute and 9 at five minutes. Bulb suctioning gently  performed. Neonatal team in attendance.Delayed cord clamping then the umbilical cord was clamped and cut cord blood was obtained for evaluation. The placenta was removed intact and appeared normal. The uterus was curetted with a dry lap pack. Good hemostasis was noted.The uterine outline, tubes and ovaries appeared normal. The uterine incision was closed with running locked sutures of 0 Monocryl x 2 layers. Hemostasis was observed. Lavage was carried out until clear.The parietal peritoneum was closed with a running 2-0 Monocryl suture. The fascia was then reapproximated with running sutures of 0 Monocryl. The skin was reapproximated with 3-0 monocryl after [place] closure with 2-0  plain. Pressure dressing applied. Instrument, sponge, and needle counts were correct prior the abdominal closure and at the conclusion of the case.   Findings: FTLM. OA, anterior placenta, no uterine septum or anomaly noted, nl adnexa  Estimated Blood Loss:  500         Drains: foley                 Specimens: placenta to path                 Complications:  None; patient tolerated the procedure well.         Disposition: PACU - hemodynamically stable.         Condition: stable  Attending Attestation: I performed the procedure.

## 2015-12-28 NOTE — Progress Notes (Signed)
S:  Intense pain again - IV meds help some / requesting repeat meds  O:  VS: Blood pressure 116/76, pulse 55, temperature 98.1 F (36.7 C), temperature source Oral, resp. rate 20, height 5\' 2"  (1.575 m), weight 72.576 kg (160 lb).        FHR : baseline 140 / variability moderate / accelerations + / no decelerations        Toco: contractions irregular / mild      A: 37 weeks with HELLP syndrome     FHR category 1  P: Discussed test results c/w HELLP syndrome with patient & spouse - recommendation for induction of labor to start now and magnesium prophylaxis - considering epidural for pain control in labor  Start magnesium sulfate PIH labs every 6 hours Induction with active management:        Cytotec 25mch Q3 hours - cervical balloon when possible - AROM when balloon out with pitocin low dose protocol Pain management: stadol 2mg  IV Q3-4 hours / epidural option     Marlinda MikeBAILEY, Tayler Heiden CNM, MSN, FACNM 12/28/2015, 1:28 AM

## 2015-12-28 NOTE — Progress Notes (Signed)
Patient ID: Brianna Robinson, female   DOB: 08/07/1991, 25 y.o.   MRN: [id number] Cesarean section d/w patient. Risks vs benefits of surgery and prognosis for HELLP discussed. Possible need for blood products including platelets discussed. Pt acknowledges and consent signed. Will proceed.

## 2015-12-28 NOTE — Progress Notes (Signed)
S:  Worsening epigastric pain / some blurred vision         No headache         Aware of ctx - increased epigastric pain  O:  VS: Blood pressure 125/77, pulse 68, temperature 98.5 F (36.9 C), temperature source Oral, resp. rate 18, height _0  (1.575 m), weight 72.576 kg (160 lb), SpO2 99 %.        FHR : baseline 140 / variability moderate / accelerations + / no decelerations        Toco: contractions every 2-3 minutes          Cervix : 1.5cm / 60% / vtx -1   Results for Brianna Robinson, Brianna Robinson (MRN [id number]) as of 12/28/2015 07:42  Ref. Range 12/28/2015 06:21  Sodium Latest Ref Range: 135-145 mmol/L 136  Potassium Latest Ref Range: 3.5-5.1 mmol/L 4.0  Chloride Latest Ref Range: 101-111 mmol/L 106  CO2 Latest Ref Range: 22-32 mmol/L 24  BUN Latest Ref Range: 6-20 mg/dL 7  Creatinine Latest Ref Range: 0.44-1.00 mg/dL 0.63  Calcium Latest Ref Range: 8.9-10.3 mg/dL 8.0 (L)  EGFR (Non-African Amer.) Latest Ref Range: >60 mL/min >60  EGFR (African American) Latest Ref Range: >60 mL/min >60  Glucose Latest Ref Range: 65-99 mg/dL 98  Anion gap Latest Ref Range: 5-15  6  Alkaline Phosphatase Latest Ref Range: 38-126 U/L 114  Albumin Latest Ref Range: 3.5-5.0 g/dL 3.1 (L)  AST Latest Ref Range: 15-41 U/L 160 (H)  ALT Latest Ref Range: 14-54 U/L 102 (H)  Total Protein Latest Ref Range: 6.5-8.1 g/dL 6.1 (L)  Total Bilirubin Latest Ref Range: 0.3-1.2 mg/dL 0.5  WBC Latest Ref Range: 4.0-10.5 K/uL 11.6 (H)  RBC Latest Ref Range: 3.87-5.11 MIL/uL 3.64 (L)  Hemoglobin Latest Ref Range: 12.0-15.0 g/dL 11.1 (L)  HCT Latest Ref Range: 36.0-46.0 % 32.1 (L)  MCV Latest Ref Range: 78.0-100.0 fL 88.2  MCH Latest Ref Range: 26.0-34.0 pg 30.5  MCHC Latest Ref Range: 30.0-36.0 g/dL 34.6  RDW Latest Ref Range: 11.5-15.5 % 13.2  Platelets Latest Ref Range: 150-400 K/uL 98 (L)    A: latent labor     FHR category 1  P: discussed with patient worsening labs and symptoms & remote from delivery      septate  uterus   Risks and benefits reviewed for continued induction with close monitoring of labs - unproven pelvis with septate uterus give uncertain timeline for vaginal birth - labs will continue to worsen until delivery. Labor management would include cervical balloon with traction and pitocin - need for IUPC when possible with septate uterus. Generalized increase risk for cesarean section given current risk factors & status.   Risk for bleeding / possible need for platelet transfusion with surgery and with worsening labs.  Discussion between patient, spouse, family with decision to proceed to C-section at this time.   Dr Ronita Hipps updated with lab results / patient desire to proceed to c-section   Artelia Laroche CNM, MSN, [facility] 12/28/2015, 7:47 AM

## 2015-12-28 NOTE — Progress Notes (Signed)
Discussing risks and benefits of CS with pt. Pt verbalizes understanding and has no further questions.

## 2015-12-28 NOTE — Anesthesia Postprocedure Evaluation (Signed)
Anesthesia Post Note  Patient: Brianna Robinson  Procedure(s) Performed: Procedure(s) (LRB): CESAREAN SECTION (N/A)  Patient location during evaluation: PACU Anesthesia Type: Epidural Level of consciousness: awake Pain management: pain level controlled Vital Signs Assessment: post-procedure vital signs reviewed and stable Respiratory status: spontaneous breathing Cardiovascular status: stable Postop Assessment: no headache, no backache, epidural receding, patient able to bend at knees and no signs of nausea or vomiting Anesthetic complications: no    Last Vitals:  Filed Vitals:   12/28/15 1045 12/28/15 1100  BP: 99/73 105/80  Pulse: 69 76  Temp:    Resp: 16 21    Last Pain:  Filed Vitals:   12/28/15 1111  PainSc: 7                  Jerik Falletta JR,JOHN [place]

## 2015-12-28 NOTE — Anesthesia Preprocedure Evaluation (Addendum)
Anesthesia Evaluation  Patient identified by MRN, date of birth, ID band Patient awake    Reviewed: Allergy & Precautions, Patient's Chart, lab work & pertinent test results  Airway Mallampati: III  TM Distance: >3 FB Neck ROM: Full    Dental no notable dental hx. (+) Teeth Intact   Pulmonary asthma ,    Pulmonary exam normal breath sounds clear to auscultation       Cardiovascular hypertension, Normal cardiovascular exam Rhythm:Regular Rate:Normal     Neuro/Psych  Headaches, negative neurological ROS  negative psych ROS   GI/Hepatic Neg liver ROS, GERD  Medicated and Controlled,  Endo/Other  negative endocrine ROS  Renal/GU negative Renal ROS  negative genitourinary   Musculoskeletal negative musculoskeletal ROS (+)   Abdominal   Peds  Hematology  (+) anemia ,   Anesthesia Other Findings   Reproductive/Obstetrics (+) Pregnancy HELLP syndrome                             Anesthesia Physical Anesthesia Plan  ASA: III  Anesthesia Plan: Epidural   Post-op Pain Management:    Induction:   Airway Management Planned: Natural Airway  Additional Equipment:   Intra-op Plan:   Post-operative Plan:   Informed Consent: I have reviewed the patients History and Physical, chart, labs and discussed the procedure including the risks, benefits and alternatives for the proposed anesthesia with the patient or authorized representative who has indicated his/her understanding and acceptance.     Plan Discussed with: Anesthesiologist, CRNA and Surgeon  Anesthesia Plan Comments: (For C/S for worsening HELLP.)       Anesthesia Quick Evaluation

## 2015-12-28 NOTE — Progress Notes (Signed)
Called Wiliam Ke Bailey CNM- CNM coming to check pt- unable to place 0600 cytotec.

## 2015-12-28 NOTE — Anesthesia Procedure Notes (Signed)
Epidural Patient location during procedure: OB Start time: 12/28/2015 3:08 AM End time: 12/28/2015 3:21 AM  Staffing Anesthesiologist: Mal AmabileFOSTER, [employer] Performed by: anesthesiologist   Preanesthetic Checklist Completed: patient identified, site marked, surgical consent, pre-op evaluation, timeout performed, IV checked, risks and benefits discussed and monitors and equipment checked  Epidural Patient position: sitting Prep: site prepped and draped and DuraPrep Patient monitoring: continuous pulse ox and blood pressure Approach: midline Location: L3-L4 Injection technique: LOR air  Needle:  Needle type: Tuohy  Needle gauge: 17 G Needle length: 9 cm and 9 Needle insertion depth: 4 cm Catheter type: closed end flexible Catheter size: 19 Gauge Catheter at skin depth: 9 cm Test dose: negative and Other  Assessment Events: blood not aspirated, injection not painful, no injection resistance, negative IV test and no paresthesia  Additional Notes Patient identified. Risks and benefits discussed including failed block, incomplete  Pain control, post dural puncture headache, nerve damage, paralysis, blood pressure Changes, nausea, vomiting, reactions to medications-both toxic and allergic and post Partum back pain. All questions were answered. Patient expressed understanding and wished to proceed. Sterile technique was used throughout procedure. Epidural site was Dressed with sterile barrier dressing. No paresthesias, signs of intravascular injection Or signs of intrathecal spread were encountered.  Dry catheter placed for future epidural infusion for Labor and Delivery.  Please see RN's note for documentation of vital signs and FHR which are stable. Will recheck platelet count after delivery prior to discontinuing epidural catheter. Discussed with RN.

## 2015-12-29 ENCOUNTER — Encounter (HOSPITAL_COMMUNITY): Payer: Self-pay | Admitting: Obstetrics and Gynecology

## 2015-12-29 LAB — COMPREHENSIVE METABOLIC PANEL
ALBUMIN: 2.8 g/dL — AB (ref 3.5–5.0)
ALT: 59 U/L — ABNORMAL HIGH (ref 14–54)
ALT: 52 U/L (ref 14–54)
AST: 51 U/L — ABNORMAL HIGH (ref 15–41)
AST: 36 U/L (ref 15–41)
Albumin: 2.4 g/dL — ABNORMAL LOW (ref 3.5–5.0)
Alkaline Phosphatase: 93 U/L (ref 38–126)
Alkaline Phosphatase: 105 U/L (ref 38–126)
Anion gap: 6 (ref 5–15)
Anion gap: 6 (ref 5–15)
BUN: 9 mg/dL (ref 6–20)
BUN: 10 mg/dL (ref 6–20)
CHLORIDE: 106 mmol/L (ref 101–111)
CO2: 26 mmol/L (ref 22–32)
CO2: 26 mmol/L (ref 22–32)
Calcium: 6.8 mg/dL — ABNORMAL LOW (ref 8.9–10.3)
Calcium: 7.5 mg/dL — ABNORMAL LOW (ref 8.9–10.3)
Chloride: 102 mmol/L (ref 101–111)
Creatinine, Ser: 0.6 mg/dL (ref 0.44–1.00)
Creatinine, Ser: 0.65 mg/dL (ref 0.44–1.00)
GFR calc Af Amer: >60 mL/min (ref >60)
GFR calc Af Amer: >60 mL/min (ref >60)
GFR calc non Af Amer: >60 mL/min (ref >60)
GFR calc non Af Amer: >60 mL/min (ref >60)
GLUCOSE: 95 mg/dL (ref 65–99)
Glucose, Bld: 100 mg/dL — ABNORMAL HIGH (ref 65–99)
POTASSIUM: 4 mmol/L (ref 3.5–5.1)
Potassium: 4.1 mmol/L (ref 3.5–5.1)
SODIUM: 138 mmol/L (ref 135–145)
Sodium: 134 mmol/L — ABNORMAL LOW (ref 135–145)
Total Bilirubin: 0.1 mg/dL — ABNORMAL LOW (ref 0.3–1.2)
Total Bilirubin: 0.2 mg/dL — ABNORMAL LOW (ref 0.3–1.2)
Total Protein: 4.8 g/dL — ABNORMAL LOW (ref 6.5–8.1)
Total Protein: 5.9 g/dL — ABNORMAL LOW (ref 6.5–8.1)

## 2015-12-29 LAB — CBC
HCT: 24.3 % — ABNORMAL LOW (ref 36.0–46.0)
HCT: 27.3 % — ABNORMAL LOW (ref 36.0–46.0)
Hemoglobin: 8.4 g/dL — ABNORMAL LOW (ref 12.0–15.0)
Hemoglobin: 9.1 g/dL — ABNORMAL LOW (ref 12.0–15.0)
MCH: 30.3 pg (ref 26.0–34.0)
MCH: 30.1 pg (ref 26.0–34.0)
MCHC: 34.6 g/dL (ref 30.0–36.0)
MCHC: 33.3 g/dL (ref 30.0–36.0)
MCV: 87.7 fL (ref 78.0–100.0)
MCV: 90.4 fL (ref 78.0–100.0)
PLATELETS: 162 10*3/uL (ref 150–400)
Platelets: 105 10*3/uL — ABNORMAL LOW (ref 150–400)
RBC: 2.77 MIL/uL — ABNORMAL LOW (ref 3.87–5.11)
RBC: 3.02 MIL/uL — ABNORMAL LOW (ref 3.87–5.11)
RDW: 13.5 % (ref 11.5–15.5)
RDW: 13.8 % (ref 11.5–15.5)
WBC: 11.6 10*3/uL — ABNORMAL HIGH (ref 4.0–10.5)
WBC: 16.1 10*3/uL — AB (ref 4.0–10.5)

## 2015-12-29 LAB — RPR: RPR Ser Ql: NONREACTIVE

## 2015-12-29 MED ORDER — METHYLERGONOVINE MALEATE 0.2 MG PO TABS: 0.2000 mg | ORAL_TABLET | ORAL | Status: DC | PRN

## 2015-12-29 MED ORDER — SIMETHICONE 80 MG PO CHEW
80.0000 mg | CHEWABLE_TABLET | Freq: Three times a day (TID) | ORAL | Status: DC
  Administered 2015-12-30 – 2015-12-31 (×5): 80 mg via ORAL
  Filled 2015-12-29 (×6): qty 1

## 2015-12-29 MED ORDER — OXYCODONE-ACETAMINOPHEN 5-325 MG PO TABS
1.0000 | ORAL_TABLET | ORAL | Status: DC | PRN
  Administered 2015-12-30 – 2015-12-31 (×4): 1 via ORAL

## 2015-12-29 MED ORDER — ACETAMINOPHEN 325 MG PO TABS: 650.0000 mg | ORAL_TABLET | ORAL | Status: DC | PRN

## 2015-12-29 MED ORDER — METHYLERGONOVINE MALEATE 0.2 MG/ML IJ SOLN: 0.2000 mg | INTRAMUSCULAR | Status: DC | PRN

## 2015-12-29 MED ORDER — MENTHOL 3 MG MT LOZG
1.0000 | LOZENGE | OROMUCOSAL | Status: DC | PRN
  Filled 2015-12-29: qty 9

## 2015-12-29 MED ORDER — LANOLIN HYDROUS EX OINT: 1.0000 "application " | TOPICAL_OINTMENT | CUTANEOUS | Status: DC | PRN

## 2015-12-29 MED ORDER — DIPHENHYDRAMINE HCL 25 MG PO CAPS: 25.0000 mg | ORAL_CAPSULE | Freq: Four times a day (QID) | ORAL | Status: DC | PRN

## 2015-12-29 MED ORDER — TETANUS-DIPHTH-ACELL PERTUSSIS 5-2.5-18.5 LF-MCG/0.5 IM SUSP
0.5000 mL | Freq: Once | INTRAMUSCULAR | Status: DC
  Filled 2015-12-29: qty 0.5

## 2015-12-29 MED ORDER — SIMETHICONE 80 MG PO CHEW
80.0000 mg | CHEWABLE_TABLET | ORAL | Status: DC
  Administered 2015-12-30 (×2): 80 mg via ORAL
  Filled 2015-12-29 (×2): qty 1

## 2015-12-29 MED ORDER — SENNOSIDES-DOCUSATE SODIUM 8.6-50 MG PO TABS
2.0000 | ORAL_TABLET | ORAL | Status: DC
  Administered 2015-12-30 (×2): 2 via ORAL
  Filled 2015-12-29 (×2): qty 2

## 2015-12-29 MED ORDER — WITCH HAZEL-GLYCERIN EX PADS: 1.0000 "application " | MEDICATED_PAD | CUTANEOUS | Status: DC | PRN

## 2015-12-29 MED ORDER — OXYTOCIN 10 UNIT/ML IJ SOLN: 2.5000 [IU]/h | INTRAVENOUS | Status: DC

## 2015-12-29 MED ORDER — IBUPROFEN 600 MG PO TABS
600.0000 mg | ORAL_TABLET | Freq: Four times a day (QID) | ORAL | Status: DC
  Administered 2015-12-30 – 2015-12-31 (×7): 600 mg via ORAL
  Filled 2015-12-29 (×7): qty 1

## 2015-12-29 MED ORDER — SIMETHICONE 80 MG PO CHEW
80.0000 mg | CHEWABLE_TABLET | ORAL | Status: DC | PRN
  Administered 2015-12-29: 80 mg via ORAL

## 2015-12-29 MED ORDER — OXYCODONE-ACETAMINOPHEN 5-325 MG PO TABS: 2.0000 | ORAL_TABLET | ORAL | Status: DC | PRN

## 2015-12-29 MED ORDER — MAGNESIUM SULFATE 50 % IJ SOLN
2.0000 g/h | INTRAMUSCULAR | Status: DC
  Filled 2015-12-29: qty 80

## 2015-12-29 MED ORDER — DIBUCAINE 1 % RE OINT
1.0000 | TOPICAL_OINTMENT | RECTAL | Status: DC | PRN
Start: 2015-12-29 — End: 2015-12-31

## 2015-12-29 MED ORDER — PRENATAL MULTIVITAMIN CH
1.0000 | ORAL_TABLET | Freq: Every day | ORAL | Status: DC
  Administered 2015-12-30 – 2015-12-31 (×2): 1 via ORAL
  Filled 2015-12-29 (×2): qty 1

## 2015-12-29 MED ORDER — OXYCODONE-ACETAMINOPHEN 5-325 MG PO TABS
1.0000 | ORAL_TABLET | ORAL | Status: DC | PRN
  Administered 2015-12-29: 1 via ORAL
  Administered 2015-12-29 (×2): 2 via ORAL
  Administered 2015-12-29 – 2015-12-31 (×2): 1 via ORAL
  Filled 2015-12-29: qty 1
  Filled 2015-12-29 (×2): qty 2
  Filled 2015-12-29 (×3): qty 1
  Filled 2015-12-29: qty 2
  Filled 2015-12-29 (×2): qty 1

## 2015-12-29 MED ORDER — LACTATED RINGERS IV SOLN: INTRAVENOUS | Status: DC

## 2015-12-29 NOTE — Progress Notes (Signed)
Patient updated and educated on benefit of ambulation.  Patient encouraged to ambulate in hall every two hours and to bathroom as well.  Also to increase fluid intake.  Plan outlined at patient's request for encouragement and placed at bedside.

## 2015-12-29 NOTE — Lactation Note (Signed)
This note was copied from a baby's chart. Lactation Consultation Note  Patient Name: Brianna Robinson AVWUJ'WToday's Date: 12/29/2015 Reason for consult: Follow-up assessment Baby at 36 hr of life and mom requested help latching. Mom stated that she has pumped for 30 minutes and tried manual expression but can not get enough to feed the baby. Mom was willing to try latching baby. Baby was on for about 10 minutes before mom stated that she was in so much pain from the incision that she needed to stop bf. She took a break and then was able to latch baby again. Because no swallows were heard mom agreed to a 5 fr with Aluminium. She was only able to let baby feed for about 5-6 minutes before reporting more pain. Demonstrated finger feeding to FOB. Baby took a total of 12 ml. Mom is going to offer the breast with the feeding tube throughout the night. If mom is unable to bf, FOB will offer finger feedings while mom uses the DEBP. They are aware of the amounts and frequency they should be feeding baby. Discussed moving to a bottle of pumped milk or formula if mom continues to be unable to latch baby.  Baby does have a tight mouth. He holds his shoulders up/tight. He can extend tongue over gum ridge for 3-6 sucks, pulls tongue in, tongue quivers, he then bites down. He can lift tongue to roof and has some lateralization of tongue. A lingual frenulum with an insertion point near mid tongue was visible. The more baby worked with a gloved finger the better his sucking got, but he might cause mom to have sore nipples if he keeps biting down. Mom is aware of this and we discussed nipple care along with suck training.       Maternal Data    Feeding Feeding Type: Breast Fed Length of feed: 10 min  LATCH Score/Interventions Latch: Repeated attempts needed to sustain latch, nipple held in mouth throughout feeding, stimulation needed to elicit sucking reflex. Intervention(s): Adjust position;Assist with latch;Breast  massage;Breast compression  Audible Swallowing: None Intervention(s): Skin to skin;Hand expression Intervention(s): Alternate breast massage  Type of Nipple: Everted at rest and after stimulation  Comfort (Breast/Nipple): Soft / non-tender     Hold (Positioning): Full assist, staff holds infant at breast Intervention(s): Support Pillows;Position options  LATCH Score: 5  Lactation Tools Discussed/Used Tools: Nipple Shields;53F feeding tube / Syringe Nipple shield size: 20   Consult Status Consult Status: Follow-up Date: 12/30/15 Follow-up type: In-patient    Rulon Eisenmengerlizabeth E Mariana Wiederholt 12/29/2015, 9:26 PM

## 2015-12-29 NOTE — Progress Notes (Signed)
POSTOPERATIVE DAY # 1 S/P CS  S:         Reports feeling really tired and weak             Tolerating po intake / no nausea / no vomiting / no flatus / no BM             Bleeding is light             Pain controlled with motrin and percocet             Up ad lib / ambulatory/ voiding QS  Newborn breast feeding    O:  VS: BP 107/72 mmHg  Pulse 69  Temp(Src) 98.7 F (37.1 C) (Oral)  Resp 16  Ht 5\' 2"  (1.575 m)  Wt 72.576 kg (160 lb)  BMI 29.26 kg/m2  SpO2 97%  Breastfeeding? Unknown   LABS:               Recent Labs  12/28/15 1020 12/29/15 0507  WBC 13.7* 11.6*  HGB 11.1* 8.4*  PLT 119* 105*               Bloodtype: --/--/A NEG (04/05 2332)  Rubella: Immune (09/12 0000)                                             I&O: Intake/Output      04/06 0701 - 04/07 0700 04/07 0701 - 04/08 0700   P.O. 780    I.V. (mL/kg) 2491.7 (34.3)    Total Intake(mL/kg) 3271.7 (45.1)    Urine (mL/kg/hr) 1775 (1)    Blood 850 (0.5)    Total Output 2625     Net +646.7                       Physical Exam:             Alert and Oriented X3  Lungs: Clear and unlabored  Heart: regular rate and rhythm / no mumurs  Abdomen: soft, non-tender, non-distended, hypoactive BS             Fundus: firm, non-tender, U-1             Dressing intact pressure dressing             Extremities: 1+ edema, no calf pain or tenderness, SCd in place  A:        POD # 1 S/P CS            HELLP            Severe pre-eclampsia  P:        Routine postoperative care              Magnesium off per MD order this am     Marlinda MikeBAILEY, Blakelyn Dinges CNM, MSN, [facility] 12/29/2015, 7:52 AM

## 2015-12-29 NOTE — Lactation Note (Signed)
This note was copied from a baby's chart. Lactation Consultation Note   Follow up[ consult with this mom and early term baby, now 3630 hours old. The baby has not breast fed, but mom has been hand expressing 5-8 ml's of colostrum and spoon feeding the baby. On exam of baby, he holds his shoulders up tight, and his mouth is very tight, and with crying, his tongue is far back in his mouth. With finger sucking, he clamps down, but is able to get into a rhythm, and bring his tongue, close to the gumoline, but not over it. I advised mom that when she gets home,  to do tummy time and gentle head turning, side to side, to see is this loosens up his muscles around his neck and shoulders., and then make  Feeding easier for Micah. He would not open his mouth to latch today, even after gum massage and suck training, so I fitted mom with  a  20 nipple shield, instructed mom in it's application and care. I filled the shield with EBM, and the was able to latch the baby onto the shield. His latch was shallow, still tight, but he did have intermittent suckles, maybe a total of 10 minutes out of 30 at the breast. He was circumcised 4 hours ago, so this probably adds to his sleepiness.  Mom was pleased to see him breast feed. I wrote out a feeding plan for her, - 1 Massage gums with EBM, finger sucking, prior to latching, 2Apply nipple shield and fill with EBM,3 latch baby , 4 stimulate Micah to suckle as needed.5  Limit time at abreast to 15 minutes, unless he is actively feeding.6 Offer   EBM via spoon  & pump for 15 minutes in initiation setting, followed by hand expression.   Patient Name: Boy Joyce Copaara Shanafelt EAVWU'JToday's Date: 12/29/2015 Reason for consult: Follow-up assessment     Maternal Data    Feeding Feeding Type: Breast Fed Length of feed: 10 min (aabout 30 minutes a t the breast, maybe 10 minutes of sucking, with nipple shield)  LATCH Score/Interventions Latch: Repeated attempts needed to sustain latch, nipple held  in mouth throughout feeding, stimulation needed to elicit sucking reflex. Intervention(s): Adjust position;Assist with latch;Breast massage  Audible Swallowing: A few with stimulation Intervention(s): Skin to skin;Hand expression  Type of Nipple: Everted at rest and after stimulation (shosrt shaft, small but evert)  Comfort (Breast/Nipple): Soft / non-tender     Hold (Positioning): Assistance needed to correctly position infant at breast and maintain latch. Intervention(s): Breastfeeding basics reviewed;Support Pillows;Position options;Skin to skin  LATCH Score: 7  Lactation Tools Discussed/Used Tools: Nipple Shields Nipple shield size: 20 Pump Review: Setup, frequency, and cleaning;Milk Storage;Other (comment) Initiated by:: bedside RN Date initiated:: 12/28/15   Consult Status Consult Status: Follow-up Date: 12/30/15 Follow-up type: In-patient    Alfred LevinsLee, Sylvan Sookdeo Anne 12/29/2015, 4:51 PM

## 2015-12-29 NOTE — Lactation Note (Signed)
This note was copied from a baby's chart. Lactation Consultation Note  Patient Name: Brianna Robinson ZOXWR'UToday's Date: 12/29/2015 Reason for consult: Follow-up assessment   With this mom of an early term baby, now 5926 hours old, and at 3% weight loss around 14 hours of life. Mo hand expressed colostrum, and baby was spoon fed  8 ml's at 1030 am. Mom in pain at this time, and just received pai9n medicine. Baby asleep in crib. I told mom I would follow up with them later toady.    Maternal Data    Feeding Feeding Type: Breast Milk  LATCH Score/Interventions                      Lactation Tools Discussed/Used     Consult Status Consult Status: Follow-up Date: 12/29/15 Follow-up type: In-patient    Alfred LevinsLee, Catheryn Slifer Anne 12/29/2015, 12:48 PM

## 2015-12-30 MED ORDER — RHO D IMMUNE GLOBULIN 1500 UNIT/2ML IJ SOSY
300.0000 ug | PREFILLED_SYRINGE | Freq: Once | INTRAMUSCULAR | Status: AC
  Administered 2015-12-30: 300 ug via INTRAVENOUS
  Filled 2015-12-30: qty 2

## 2015-12-30 NOTE — Progress Notes (Signed)
POSTOPERATIVE DAY # 2 S/P CS - HELLP  S:         Reports feeling better today - sore / no epigastric pain             Tolerating po intake / no nausea / no vomiting / + flatus / no BM             Bleeding is light            Up ad lib / ambulatory/ voiding QS   O:  VS: BP 110/77 mmHg  Pulse 79  Temp(Src) 98.2 F (36.8 C) (Oral)  Resp 16  Ht 5\' 2"  (1.575 m)  Wt 72.576 kg (160 lb)  BMI 29.26 kg/m2  SpO2 97%  Breastfeeding? Unknown   LABS:               Recent Labs  12/29/15 0507 12/29/15 2146  WBC 11.6* 16.1*  HGB 8.4* 9.1*  PLT 105* 162               Bloodtype: --/--/A NEG (04/08 0840)  / Rhogam given  Rubella: Immune (09/12 0000)                                             I&O: Intake/Output      04/07 0701 - 04/08 0700 04/08 0701 - 04/09 0700   P.O.     I.V. (mL/kg) 50 (0.7)    Total Intake(mL/kg) 50 (0.7)    Urine (mL/kg/hr) 1225 (0.7)    Blood     Total Output 1225     Net -1175                       Physical Exam:             Alert and Oriented X3  Lungs: Clear and unlabored  Heart: regular rate and rhythm / no mumurs  Abdomen: soft, non-tender, mildly distended             Fundus: firm, non-tender, U-1             Dressing - pressure dressing          Lochia: light  Extremities: trace edema, no calf pain or tenderness  A:        POD # 2 S/P CS            HELLP - improving status / Off magnesium today  P:        Routine postoperative care              Check labs tomorrow - consider DC home tomorrow     Marlinda MikeBAILEY, TANYA CNM, MSN, [facility] 12/30/2015, 12:28 PM

## 2015-12-30 NOTE — Lactation Note (Signed)
This note was copied from a baby's chart. Lactation Consultation Note  Patient Name: Brianna Robinson ZOXWR'UToday's Date: 12/30/2015 Reason for consult: Follow-up assessment;Breast/nipple pain  Visited with parents to assist with a feeding.  Baby 56 hrs old, and being fed by syringe and 5 fr feeding tube at the breast with nipple shield.  Baby has been increasingly sleepy, and not latching deeply causing some trauma on tip of nipples.  Baby will latch shallow and suck for about 5 minutes per Mom, before falling asleep.  Baby becoming jaundiced, bilirubin level being watch as not high enough for phototherapy.  Mother very concerned about baby not being able to latch, so talked with parents about normal early term newborn behavior.  Reassured them that baby was acting normal with regards to needing support with breast feeding.  Mom is exhausted.  Both breasts are filling, and it had been 5 hours since she pumped.  Talked about importance of regular pumping while baby to sleepy to attain a deep areolar latch.  Baby attempted at the breast in football hold, and he wouldn't root or open his mouth.  Recommended 20-30 ml EBM+l Alimentum supplementation with a slow flow bottle using the "pace method".  Parents very happy as they said the feedings have been taking an hour.  Baby fed 15 ml of EBM that Mom had pumped, by bottle.  Demonstrated pace method and baby did very well.  Assisted Mom in pumping both breasts, on regular setting due to obtaining 40 ml last pumping.  To add 5-10 ml to baby's feeding once she is finished with current pumping. Encouraged skin to skin, and attempt at latching at each feeding, at least every 3 hrs.  FOB to offer bottle, and MOB is to pump for 20 minutes.  To follow up in am, or prn as needed.   Consult Status Consult Status: Follow-up Date: 12/31/15 Follow-up type: In-patient    Judee ClaraSmith, Robin Petrakis E 12/30/2015, 5:45 PM

## 2015-12-31 LAB — TYPE AND SCREEN
ABO/RH(D): A NEG
Antibody Screen: POSITIVE
DAT, IgG: NEGATIVE
Unit division: 0
Unit division: 0

## 2015-12-31 LAB — COMPREHENSIVE METABOLIC PANEL
ALT: 38 U/L (ref 14–54)
AST: 29 U/L (ref 15–41)
Albumin: 2.6 g/dL — ABNORMAL LOW (ref 3.5–5.0)
Alkaline Phosphatase: 94 U/L (ref 38–126)
Anion gap: 4 — ABNORMAL LOW (ref 5–15)
BUN: 13 mg/dL (ref 6–20)
CO2: 27 mmol/L (ref 22–32)
Calcium: 8.2 mg/dL — ABNORMAL LOW (ref 8.9–10.3)
Chloride: 108 mmol/L (ref 101–111)
Creatinine, Ser: 0.57 mg/dL (ref 0.44–1.00)
GFR calc Af Amer: >60 mL/min (ref >60)
GFR calc non Af Amer: >60 mL/min (ref >60)
Glucose, Bld: 91 mg/dL (ref 65–99)
Potassium: 4.1 mmol/L (ref 3.5–5.1)
Sodium: 139 mmol/L (ref 135–145)
Total Bilirubin: 0.3 mg/dL (ref 0.3–1.2)
Total Protein: 5.7 g/dL — ABNORMAL LOW (ref 6.5–8.1)

## 2015-12-31 LAB — RH IG WORKUP (INCLUDES ABO/RH)
ABO/RH(D): A NEG
Fetal Screen: NEGATIVE
GESTATIONAL AGE(WKS): 37.2
Unit division: 0

## 2015-12-31 LAB — CBC
HCT: 23.5 % — ABNORMAL LOW (ref 36.0–46.0)
Hemoglobin: 8 g/dL — ABNORMAL LOW (ref 12.0–15.0)
MCH: 30.9 pg (ref 26.0–34.0)
MCHC: 34 g/dL (ref 30.0–36.0)
MCV: 90.7 fL (ref 78.0–100.0)
Platelets: 155 10*3/uL (ref 150–400)
RBC: 2.59 MIL/uL — ABNORMAL LOW (ref 3.87–5.11)
RDW: 13.8 % (ref 11.5–15.5)
WBC: 10.9 10*3/uL — ABNORMAL HIGH (ref 4.0–10.5)

## 2015-12-31 MED ORDER — POLYSACCHARIDE IRON COMPLEX 150 MG PO CAPS: 150.0000 mg | ORAL_CAPSULE | Freq: Every day | ORAL | Status: DC

## 2015-12-31 MED ORDER — MAGNESIUM OXIDE 250 MG PO TABS: 1.0000 | ORAL_TABLET | Freq: Every day | ORAL | Status: DC

## 2015-12-31 MED ORDER — IBUPROFEN 600 MG PO TABS: 600.0000 mg | ORAL_TABLET | Freq: Four times a day (QID) | ORAL | Status: DC

## 2015-12-31 MED ORDER — OXYCODONE-ACETAMINOPHEN 5-325 MG PO TABS: 1.0000 | ORAL_TABLET | ORAL | Status: DC | PRN

## 2015-12-31 NOTE — Progress Notes (Signed)
Discharge instructions given to patient.  She understands signs and symptoms of preeclampsia, and other medical emergencies.

## 2015-12-31 NOTE — Lactation Note (Signed)
This note was copied from a baby's chart. Lactation Consultation Note  Mother prepumped w/ DEBP to help evert nipple more.  Transitional breastmilk flows freely. Noted heart shaped tongue and limited tongue movement.  Labial frenulum tight also. Referred to Peds MD to evaluate. Assisted w/ latching infant in football hold.  Baby latched but did not sustain latch. Applied #16NS.  Prefilled w/ breastmilk and baby latched and fell asleep. Burped baby to wake and placed 5 FR feeding tube under #20NS. Baby latched for 5 more minutes and stopped due to sucking noise and sleepy feeder. Took out tubing and he latched for approx 15 more min. He seem to do better with #20 NS versus #16. Sucks and swallows observed. Mother's breasts are very full.  After feeding she pumped 80ml of breastmilk. Her breasts did soften some but she feels uncomfortable. Provided her w/ ice packs and suggest she only pump for 5 min after the next 2 feedings and adjust pumping time as needed until fullness readjusts.  Reviewed massage. FOB then gave baby 30 ml of pumped breastmilk in bottle. Provided mother w/ outpatient resources for tongue tie. Reviewed engorgement care and monitoring voids/stools. Reviewed volume guidelines and suggest other give baby 30+ as he desires after breastfeeding. Parents have OP appt on 4/12.    Patient Name: Boy Joyce Copaara Enamorado EAVWU'JToday's Date: 12/31/2015 Reason for consult: Follow-up assessment   Maternal Data    Feeding Feeding Type: Breast Fed Length of feed: 20 min  LATCH Score/Interventions Latch: Grasps breast easily, tongue down, lips flanged, rhythmical sucking.  Audible Swallowing: A few with stimulation  Type of Nipple: Everted at rest and after stimulation  Comfort (Breast/Nipple): Filling, red/small blisters or bruises, mild/mod discomfort  Problem noted: Mild/Moderate discomfort Interventions (Filling): Massage;Double electric pump;Frequent nursing Interventions  (Mild/moderate discomfort): Reverse pressue  Hold (Positioning): Assistance needed to correctly position infant at breast and maintain latch.  LATCH Score: 7  Lactation Tools Discussed/Used Tools: Nipple Shields;5F feeding tube / Syringe Breast pump type: Double-Electric Breast Pump   Consult Status Consult Status: Follow-up Date: 01/03/16 Follow-up type: Out-patient    Dahlia ByesBerkelhammer, [facility] 12/31/2015, 1:42 PM

## 2015-12-31 NOTE — Discharge Summary (Signed)
POSTOPERATIVE DISCHARGE SUMMARY:  Patient ID: Brianna Robinson MRN: [id number] DOB/AGE: 26/11/1990 26 y.o.  Admit date: 12/27/2015 Admission Diagnoses: 37.2 weeks / abdominal pain / elevated LE  Discharge date:  12/31/2015 Discharge Diagnoses: POD 3 s/p CS / HELLP / severe pre-eclampsia  Prenatal history: G1P1001   EDC : 01/16/2016, by Other Basis  [facility]  Primary provider : Kathi LudwigBailey CNM Prenatal course complicated by US finding of septate uterus / previa - resolved by third trimester  Prenatal Labs: ABO, Rh: --/--/A NEG (04/08 0840) / Rhophylac given antepartum Antibody: Negative Rubella: Immune (09/12 0000)   RPR: Non Reactive (04/05 2332)  HBsAg: Negative (09/12 0000)  HIV: Non-reactive (09/12 0000)  GBS: Negative (03/30 0000)   Medical / Surgical History :  Past medical history:  Past Medical History  Diagnosis Date  . Asthma   . Headache(784.0)   . Septate uterus   . Postpartum care following cesarean delivery (4/6) 12/28/2015    Past surgical history:  Past Surgical History  Procedure Laterality Date  . Cyst excision    . Cesarean section N/A 12/28/2015    Procedure: CESAREAN SECTION;  Surgeon: Olivia Mackieichard Taavon, MD;  Location: [facility];  Service: Obstetrics;  Laterality: N/A;    Family History:  Family History  Problem Relation Age of Onset  . Ataxia Neg Hx   . Chorea Neg Hx   . Dementia Neg Hx   . Mental retardation Neg Hx   . Migraines Neg Hx   . Multiple sclerosis Neg Hx   . Neurofibromatosis Neg Hx   . Neuropathy Neg Hx   . Parkinsonism Neg Hx   . Seizures Neg Hx   . Stroke Neg Hx     Social History:  reports that she has never smoked. She does not have any smokeless tobacco history on file. She reports that she drinks alcohol. She reports that she does not use illicit drugs.  Allergies: Amoxicillin   Current Medications at time of admission:  Prior to Admission medications   Medication Sig Start Date End Date Taking?  Authorizing Provider  docusate sodium (COLACE) 100 MG capsule Take 100 mg by mouth daily.   Yes Historical Provider, MD  ferrous sulfate 325 (65 FE) MG tablet Take 325 mg by mouth daily with breakfast.   Yes Historical Provider, MD  Prenatal Vit-Fe Fumarate-FA (PRENATAL MULTIVITAMIN) TABS tablet Take 1 tablet by mouth daily at 12 noon.   Yes Historical Provider, MD   Hopspital Course:  Admit for generalized abdominal pain without evidence of labor  Initial liver enzymes high normal (44/32) with NL platelet count (166) - but noted lower than week prior with PLT 209 (CBC check for IDA of pregnancy) Liver enzymes continued to rise (160/102) and platelet count fell to nadir of 98 less than 24 hours from admission Labile hypertension without severe range Pain management: IV Nubain and Stadol and epidural Magnesium sulfate initiated prior to delivery Initial plan for IOL but with worsening pain and labs & remote from delivery, there was discussion & decision made to proceed to cesarean section delivery  Procedures: Cesarean section delivery on 12/28/2015 with delivery of viable female newborn by Dr Billy Coastaavon   See operative report for further details APGAR (1 MIN): 8   APGAR (5 MINS): 9    Postoperative / postpartum course:  Uncomplicated with discharge on POD 3 Magnesium sulfate postpartum x 24 hours with adequate diuresis IDA with compounded ABL anemia  Discharge Instructions:  Discharged Condition:  stable  Activity: pelvic rest and postoperative restrictions x 2   Diet: routine  Medications:    Medication List    STOP taking these medications        ferrous sulfate 325 (65 FE) MG tablet      TAKE these medications        docusate sodium 100 MG capsule  Commonly known as:  COLACE  Take 100 mg by mouth daily.     ibuprofen 600 MG tablet  Commonly known as:  ADVIL,MOTRIN  Take 1 tablet (600 mg total) by mouth every 6 (six) hours.     iron polysaccharides 150 MG capsule   Commonly known as:  NIFEREX  Take 1 capsule (150 mg total) by mouth daily. Twice daily x 2 weeks then once daily     Magnesium Oxide 250 MG Tabs  Take 1-2 tablets (250-500 mg total) by mouth daily.     oxyCODONE-acetaminophen 5-325 MG tablet  Commonly known as:  PERCOCET/ROXICET  Take 1-2 tablets by mouth every 4 (four) hours as needed for severe pain.     prenatal multivitamin Tabs tablet  Take 1 tablet by mouth daily at 12 noon.        Wound Care: keep clean and dry  Postpartum Instructions: [facility] discharge booklet - instructions reviewed  Discharge to: Home  Follow up :  [facility] in 1-3 days for interval visit with Fredric Mare CNM  for BP re-check / BF recheck / removal of dressing [facility] in 6 weeks for routine postpartum visit with Fredric Mare CNM                Signed: Marlinda Mike CNM, MSN, [facility] 12/31/2015, 10:03 AM

## 2015-12-31 NOTE — Progress Notes (Signed)
POSTOPERATIVE DAY # 3 S/P CS - HELLP  S:         Reports feeling ok today - no PIH symptoms             Tolerating po intake / no nausea / no vomiting / + flatus / + BM             Bleeding is light             Pain controlled with percocet             Up ad lib / ambulatory/ voiding QS  Newborn breast feeding   O:  VS: BP 112/70 mmHg  Pulse 72  Temp(Src) 98.4 F (36.9 C) (Oral)  Resp 16  Ht  (1.575 m)  Wt 72.576 kg (160 lb)  BMI 29.26 kg/m2  SpO2 97%  Breastfeeding? Unknown   LABS:               Recent Labs  12/29/15 2146 12/31/15 0614  WBC 16.1* 10.9*  HGB 9.1* 8.0*  PLT 162 155   Results for ALEXEI, EY (MRN [id number]) as of 12/31/2015 09:42  Ref. Range 12/31/2015 06:14  Sodium Latest Ref Range: 135-145 mmol/L 139  Potassium Latest Ref Range: 3.5-5.1 mmol/L 4.1  CO2 Latest Ref Range: 22-32 mmol/L 27  BUN Latest Ref Range: 6-20 mg/dL 13  Creatinine Latest Ref Range: 0.44-1.00 mg/dL 6.96  Glucose Latest Ref Range: 65-99 mg/dL 91  Anion gap Latest Ref Range: 5-15  4 (L)  Alkaline Phosphatase Latest Ref Range: 38-126 U/L 94  Albumin Latest Ref Range: 3.5-5.0 g/dL 2.6 (L)  AST Latest Ref Range: 15-41 U/L 29  ALT Latest Ref Range: 14-54 U/L 38               Bloodtype: --/--/A NEG (04/08 0840) - rhogam given  Rubella: Immune (09/12 0000)                                             I&O: not recorded by nursing staff             Physical Exam:             Alert and Oriented X3  Lungs: Clear and unlabored  Heart: regular rate and rhythm / no mumurs  Abdomen: soft, non-tender, non-distended, active BS             Fundus: firm, non-tender, U-2             Dressing intact honeycomb              Incision:  approximated with suture / no erythema / no ecchymosis / small dried drainage  Perineum: intact  Lochia: light  Extremities: trace edema, no calf pain or tenderness, negative Homans  A:        POD # 3 S/P CS            HELLP  / severe pre-eclampsia -  improving & stable            IDA of pregnancy with ABL anemia  P:        Routine postoperative care              DC home             OV WOB in 1-3 days at [facility]OB / DC instructions  reviewed   Marlinda MikeBAILEY, Rhiann Boucher CNM, MSN, [facility] 12/31/2015, 9:42 AM

## 2015-12-31 NOTE — Lactation Note (Signed)
This note was copied from a baby's chart. Lactation Consultation Note  Mother wil call for LC to view next feeding. Family has OP on 4/12. Discussed prepuming before latching and reviewed LPI policy. Reviewed milk storage guidelines and feeding at least q3. Reviewed engorgement care and monitoring voids/stools.    Patient Name: Brianna Joyce Copaara Nack EAVWU'JToday's Date: 12/31/2015     Maternal Data    Feeding    LATCH Score/Interventions                      Lactation Tools Discussed/Used     Consult Status      Brianna Robinson, Brianna Robinson 12/31/2015, 11:03 AM

## 2016-01-03 ENCOUNTER — Ambulatory Visit (HOSPITAL_COMMUNITY)
Admit: 2016-01-03 | Discharge: 2016-01-03 | Disposition: A | Discharge status: Home / Self Care | Payer: BLUE CROSS/BLUE SHIELD | Attending: Certified Nurse Midwife | Admitting: Certified Nurse Midwife

## 2016-01-03 NOTE — Lactation Note (Signed)
Lactation Consult  Mother's reason for visit: per mom F/u after birth , small mouth , trouble latching , using a #20 NS  Visit Type: feeding assessment  Appointment Notes: very tight mouth , NS , DEP , Left message to confirm appt. For 4.12, pt. Called to confirm appt. For 4/12  Consult:  Follow-Up Lactation Consultant:  Kathrin Greathouse  ________________________________________________________________________ Brianna Robinson Name: Brianna Robinson Date of Birth: 12/28/2016 Pediatrician:Dr. Netta Cedars  Gender: female Gestational Age: [redacted]w[redacted]d (At Birth) Birth Weight: 6 lb 5.8 oz (2885 g) Weight at Discharge: Weight: 5 lb 15.9 oz (2720 g)Date of Discharge: 12/31/2015 Filed Weights   12/29/15 0100 12/30/15 0100 12/30/15 2350  Weight: 6 lb 3.1 oz (2810 g) 6 lb 0.7 oz (2740 g) 5 lb 15.9 oz (2720 g)   [facility] 6-1 oz  Location:Pediatrician's office Weight today:2846 g , 6-4.4 oz     _______________________________________________________________________  Mother's Name: Brianna Robinson Type of delivery:  C/section  Breastfeeding Experience:  1st baby with difficult latch  Maternal Medical Conditions:  Pregnancy induced hypertension and Anemia   Per mom HELLP  Maternal Medications: PNV,   ________________________________________________________________________  Breastfeeding History (Post Discharge) per mom milk came in Saturday in the hospital.  And breast have been very full ever since.   Frequency of breastfeeding: per mom attempts 4-6 times a day with #20 NS  Duration of feeding: 3 -10 mins   Supplementing: per mom EBM from a bottle ( nipple slow flow from the hospital with yellow rim )  Per dad it has been working well.   Pumping: DEBP Symhony from the [facility]  Per mom waiting for her insurance pump DEBP to come in.  6-8 times in 24 hours and last pumped at 12 N with 140 ml yield .   Infant Intake and  Output Assessment  Voids:  6-8  in 24 hrs.  Color:  Clear yellow Stools:  6- 8  in 24 hrs.  Color:  Brown and Yellow  ________________________________________________________________________  Maternal Breast Assessment  Breast:  Engorged and Non-compressible Nipple:  Erect ( after milk expressed off  Pain level:  5  ( after feeding and pumping 0  Pain interventions:  Expressed breast milk, Cold packs and Nipple shield, DEBP after feeding ( see volume expressed off)   After feeding ended up icing mom both breast 15 mins , and using the DEBP with 240 ml EBM yield and relief  bilaterally.   _______________________________________________________________________ Feeding Assessment/Evaluation - baby awake and hungry , color pink  Last fed per mom 1230 30 ml Last time mom pumped at 12n   Initial feeding assessment:  Infant's oral assessment:  Variance - see note below   Positioning:  Cross cradle Left breast  LATCH documentation:  Latch:  1 = Repeated attempts needed to sustain latch, nipple held in mouth throughout feeding, stimulation needed to elicit sucking reflex.  Audible swallowing:  2 = Spontaneous and intermittent  Type of nipple:  2 = Everted at rest and after stimulation  Comfort (Breast/Nipple):  0 = Engorged, cracked, bleeding, large blisters, severe discomfort  Hold (Positioning):  1 = Assistance needed to correctly position infant at breast and maintain latch  LATCH score: 6   Attached assessment:  Shallow @ 1st   Lips flanged:  No. flipped upper lip to flanged position   Lips untucked:  Yes.    Suck assessment:  Nutritive and Nonnutritive  Tools:  Nipple shield 20  mm Instructed on use and cleaning of tool:  Yes.    Pre-feed weight:  2846 g , 6-4.4 oz  Post-feed weight:  2848 g  Amount transferred: 2 ml  Amount supplemented: none   Additional Feeding Assessment -   Infant's oral assessment:  Variance  Positioning:  Cross cradle  Left breast  LATCH  documentation:  Latch:  2 = Grasps breast easily, tongue down, lips flanged, rhythmical sucking.  Audible swallowing:  2 = Spontaneous and intermittent  Type of nipple:  2 = Everted at rest and after stimulation  Comfort (Breast/Nipple):  1 = Filling, red/small blisters or bruises, mild/mod discomfort  Hold (Positioning):  1 = Assistance needed to correctly position infant at breast and maintain latch  LATCH score: 8   Attached assessment:  Deep  Lips flanged:  No. flipped upper lip to flanged  position   Lips untucked:  Yes.    Suck assessment:  Nutritive and Nonnutritive  Tools:  Nipple shield 24 mm Instructed on use and cleaning of tool:  Yes.    Pre-feed weight:  2848 g , 6-4.5 oz  Post-feed weight: 2878 g , 6-5.5 oz  Amount transferred:  30 ml  Amount supplemented: 30 ml EBM from a bottle ( slow flow [facility] nipple )    Total amount pumped post feeding : 240 ml ( [facility] , and [facility] relief [facility])   Total amount transferred:  32 ml  Total supplement given: 30 ml EBM  Total volume for feeding: 62 ml  Lactation Impression:  Mom very tired ( dark circles under her eyes)  Breast bilaterally engorged , ( with feeding milk transfer , hand expressing and pumping with DEBP - yield = 304 ml ) Excellent milk supply for 6 day old baby.  Mom seemed to have a misunderstanding with how to deal with engorgement and how long to post pump to establish milk supply and protect level.  Mom was concerned about over producing. LC explained engorgement prevention and tx ( supply and demand ) , also working with an Early tern infant.  Also applying the NS. - reviewed and had mom repeat application several times. Moms comfort level had improved by the end of consult .  As for baby - LC feels there is an oral variance - high palate, small mouth, decreased mobility of tongue due to short anterior frenulum and possible posterior   Intermittent humping of his tongue when baby was sucking on the LC's gloved finger.  Baby transferred 2 ml of  milk from the the breast with the #20 NS, when switched the NS to #24 - transferred 30 ml . And was able to sustain depth latch and accommodate  The larger size NS and fed 15 -711m mins. Finished feeding with a EBM supplement of 30 ml which he tolerated well and dad did well pace feeding him.   Lactation Plan of Care:  Praised mom for her efforts breast feeding and pumping, and dad for his support  Feedings 2 1/2 - 3 hours and with feeding cues  20 mins 1st breast - supplement 30 -45 ml  Cluster feedings normal with growth spurts 7-10 days , 3 weeks 6 weeks  Steps for latching -  Massage , hand express, ( if breast are overly full , express 20 ml off  Apply NS #24 , fill with the EBM form a curved tip syringe - latch with firm  support  Make sure Brianna Robinson lips are flanged - fish lips Breast compressions with latch and then intermittent  Feed 1st breast - supplement EBM - pump both breast or just other breast  Next feeding switch Brianna Robinson to the other breast to feed -  Extra pumping - in the plan and PRN.  F/U LC appt . Friday 4/21 at 230 pm . Mom requested to have same consultant.

## 2016-01-12 ENCOUNTER — Ambulatory Visit (HOSPITAL_COMMUNITY)
Admission: RE | Admit: 2016-01-12 | Discharge: 2016-01-12 | Disposition: A | Discharge status: Home / Self Care | Payer: BLUE CROSS/BLUE SHIELD | Source: Ambulatory Visit | Attending: Obstetrics and Gynecology | Admitting: Obstetrics and Gynecology

## 2016-01-12 NOTE — Lactation Note (Signed)
Lactation Consult  Mother's reason for visit:  F/IU , latching  Visit Type: F/U feeding assessment  Appointment Notes: F/U from 4/12 , appt. NS , DEBP , check frenulum. Pt . Confirmed appt. For 4/21  Consult:  Follow-Up Lactation Consultant:  Kathrin GreathouseIorio, Natajah Derderian Ann  ________________________________________________________________________ Joan FloresBaby's Name: Vaughan SineMicah Taylor Tlatelpa Date of Birth: 12/28/2016 Pediatrician: Dr. Hyacinth MeekerMiller  Gender: female Gestational Age: 7222w2d (At Birth) Birth Weight: 6 lb 5.8 oz (2885 g) Weight at Discharge: Weight: 5 lb 15.9 oz (2720 g)Date of Discharge: 12/31/2015 Filed Weights   12/29/15 0100 12/30/15 0100 12/30/15 2350  Weight: 6 lb 3.1 oz (2810 g) 6 lb 0.7 oz (2740 g) 5 lb 15.9 oz (2720 g)   [facility] 6-[facility]  Location:[facility] Weight today: 3230 g , 7-1.9 oz    __________________________________________________________________  Mother's Name: Joyce Copaara Bean Type of delivery:  C/section  Breastfeeding Experience: 1st baby  Maternal Medical Conditions:  Pregnancy induced hypertension , HELLP  Maternal Medications:  Iron , Magnesium , PNV   ________________________________________________________________________  Breastfeeding History (Post Discharge) per mom   Frequency of breastfeeding:  4-5 per day per mom  Duration of feeding:  10-20 min   Supplementing:per mom EBM in a bottle ( yellow nipple from the hospital ) breast feed 1st breast ,  and then supplements 35 -60 ml   Pumping: per mom DEBP - post pumping every 3-4 hours - 2.5 - 4 oz   Infant Intake and Output Assessment  Voids: 7-8  in 24 hrs.  Color:  Clear yellow Stools:  7-8 in 24 hrs.  Color:  Yellow  ________________________________________________________________________  Maternal Breast Assessment  Breast:  Full Nipple:  Erect Pain level:  0 Pain interventions:  Expressed breast  milk  _______________________________________________________________________ Feeding Assessment/Evaluation - baby pink , skin moist , noted a red diaper rash   Initial feeding assessment:  Infant's oral assessment:  VarianceLC assessed oral cavity with a gloved finger , baby has a strong suck, high palate,  Short labial frenulum above the gum line. Short anterior frenulum with indentation mid line. Decreased mobility of tongue.  And unable to raise above the corners of the mouth and only slightly over gum line.  Unable to latch without a NS .   Positioning:  Football Right breast  LATCH documentation:  Latch:  2 = Grasps breast easily, tongue down, lips flanged, rhythmical sucking.  Audible swallowing:  2 = Spontaneous and intermittent  Type of nipple:  2 = Everted at rest and after stimulation  Comfort (Breast/Nipple):  1 = Filling, red/small blisters or bruises, mild/mod discomfort  Hold (Positioning):  1 = Assistance needed to correctly position infant at breast and maintain latch  LATCH score: 8   Attached assessment:  Deep  Lips flanged:  Yes.    Lips untucked:  Yes.    Suck assessment:  Nutritive  Tools:  Nipple shield 24 mm Instructed on use and cleaning of tool:  Yes.    Pre-feed weight:  3230 g , 7-1.9 oz  Post-feed weight: 3308 g , 7-4.7 oz  Amount transferred:  75 ml  Amount supplemented: 3 ml ( EBM appetizer instilled in the top of the NS )    Additional Feeding Assessment -   Infant's oral assessment:  Variance see above note   Positioning:  Football Left breast  LATCH documentation: Attempted 1st to latch without the NS , on and off pattern - applied NS #24   Latch:  2 = Grasps breast easily, tongue down, lips flanged, rhythmical sucking.  Audible swallowing:  2 = Spontaneous and intermittent  Type of nipple:  2 = Everted at rest and after stimulation  Comfort (Breast/Nipple):  1 = Filling, red/small blisters or bruises, mild/mod discomfort  Hold  (Positioning):  2 = No assistance needed to correctly position infant at breast  LATCH score:  9   Attached assessment:  Deep  Lips flanged:  Yes.    Lips untucked:  Yes.    Suck assessment:  Nutritive and Nonnutritive  Tools:  Nipple shield 24 mm Instructed on use and cleaning of tool:  Yes.   Wet diaper changed  Pre-feed weight:  3278 g , 7-3.6 oz  Post-feed weight: 3284 g , 7-3.9 oz   Amount transferred:  6 ml  Amount supplemented: none needed   Baby only fed a short time and released on his own with the NS , and spit up small amount   Total amount pumped post feed: did not post pump   Total amount transferred:  81 ml  Total supplement given:  3 ml   Lactation Impression: Mom Followed the [facility] plan from last week well and protected milk supply Baby fed on the right ( football) 15 mins with #24 NS ( EBM appetizer instilled in the top)  Consistent [pattern , multiply swallows , increased with breast compressions. Per mom comfortable.  Nipple well rounded when baby released.  Left breast - attempted to latch without the nipple shield , on and off pattern , added the NS #24 without the  appetizer and baby latched with depth, multiply swallows noted. ( football ).  Per mom will be switching Pedis to Dr. Marda Stalker on [address] [facility] in the next week    Lactation Plan of Care: Keep up the great efforts breast feeding and protecting your milk supply ( pumping)  Consider attending the [facility] on Monday Evening at 7 pm or Tuesday 11am  Feedings - with feeding cues and 2-3 hours days , by 3 at night  Cluster feeding normal with growth spurts  Option #1 - work on feeding both breast , if breast are really full to start - hand express 15 -20 ml off ( small collector)  Latch with #24 - NS and add EBM appetizer instilled into the top  Breast compressions with latch until swallows. Feed - soften 1st breast well , offer 2nd breast ( following above steps )  Post - pump 10 mins  both breast if needed  Option #2 - Feed 1st breast 15 -20 mins , supplement with bottle 30 -45 ml , and psot pump other breast  Option #3 - Micah receives a bottle 60 -80 ml - gradually increase , and post pump 15 -20 mins  Look for medium broad based nipple ( Medela or Dr. Manson Passey)  Extra pumping is indicated when using a Nipple Shield for latching ( and for now until MIcah grows is needed )  Goal - protect establishing milk supply - post pump at least 10 mins when feeding at the breast.

## 2016-03-29 ENCOUNTER — Ambulatory Visit (HOSPITAL_COMMUNITY): Payer: BLUE CROSS/BLUE SHIELD | Attending: Certified Nurse Midwife

## 2016-08-03 ENCOUNTER — Encounter (HOSPITAL_BASED_OUTPATIENT_CLINIC_OR_DEPARTMENT_OTHER): Payer: Self-pay | Admitting: Emergency Medicine

## 2016-08-03 ENCOUNTER — Emergency Department (HOSPITAL_BASED_OUTPATIENT_CLINIC_OR_DEPARTMENT_OTHER)
Admission: EM | Admit: 2016-08-03 | Discharge: 2016-08-03 | Disposition: A | Discharge status: Home / Self Care | Payer: BLUE CROSS/BLUE SHIELD | Attending: Emergency Medicine | Admitting: Emergency Medicine

## 2016-08-03 DIAGNOSIS — J45909 Unspecified asthma, uncomplicated: Secondary | ICD-10-CM | POA: Diagnosis not present

## 2016-08-03 DIAGNOSIS — H53143 Visual discomfort, bilateral: Secondary | ICD-10-CM | POA: Insufficient documentation

## 2016-08-03 DIAGNOSIS — R51 Headache: Secondary | ICD-10-CM | POA: Insufficient documentation

## 2016-08-03 DIAGNOSIS — R519 Headache, unspecified: Secondary | ICD-10-CM

## 2016-08-03 DIAGNOSIS — Z791 Long term (current) use of non-steroidal anti-inflammatories (NSAID): Secondary | ICD-10-CM | POA: Diagnosis not present

## 2016-08-03 DIAGNOSIS — Z79899 Other long term (current) drug therapy: Secondary | ICD-10-CM | POA: Insufficient documentation

## 2016-08-03 DIAGNOSIS — R110 Nausea: Secondary | ICD-10-CM | POA: Diagnosis not present

## 2016-08-03 HISTORY — DX: HELLP syndrome (HELLP), unspecified trimester: O14.20

## 2016-08-03 LAB — PREGNANCY, URINE: Preg Test, Ur: NEGATIVE

## 2016-08-03 MED ORDER — DIPHENHYDRAMINE HCL 50 MG/ML IJ SOLN
25.0000 mg | Freq: Once | INTRAMUSCULAR | Status: AC
  Administered 2016-08-03: 25 mg via INTRAVENOUS
  Filled 2016-08-03: qty 1

## 2016-08-03 MED ORDER — KETOROLAC TROMETHAMINE 30 MG/ML IJ SOLN
30.0000 mg | Freq: Once | INTRAMUSCULAR | Status: AC
  Administered 2016-08-03: 30 mg via INTRAVENOUS
  Filled 2016-08-03: qty 1

## 2016-08-03 MED ORDER — MAGNESIUM SULFATE 2 GM/50ML IV SOLN
2.0000 g | Freq: Once | INTRAVENOUS | Status: AC
  Administered 2016-08-03: 2 g via INTRAVENOUS
  Filled 2016-08-03: qty 50

## 2016-08-03 MED ORDER — PROCHLORPERAZINE EDISYLATE 5 MG/ML IJ SOLN
10.0000 mg | Freq: Once | INTRAMUSCULAR | Status: AC
  Administered 2016-08-03: 10 mg via INTRAVENOUS
  Filled 2016-08-03: qty 2

## 2016-08-03 MED ORDER — SODIUM CHLORIDE 0.9 % IV BOLUS (SEPSIS)
1000.0000 mL | Freq: Once | INTRAVENOUS | Status: AC
  Administered 2016-08-03: 1000 mL via INTRAVENOUS

## 2016-08-03 NOTE — ED Provider Notes (Signed)
[facility] Provider Note   CSN: [id number] Arrival date & time: 08/03/16  1219     History   Chief Complaint Chief Complaint  Patient presents with  . Headache    HPI Brianna Robinson is a 25 y.o. female.  25 year old female with history of headaches who presents with headache and nausea. 2 days ago, the patient had a sudden onset of frontal headache. She has a long-standing history of headaches and states that she has had sudden headaches before. This headache is the same type of headache that she has had previously but it has lasted longer than usual and been more intense than usual. She reports pain behind her eyes, photophobia, and nausea. No vomiting, fevers, cough/cold symptoms, or recent illness. She has taken Imitrex twice over the last 2 days, last dose at 8 AM, with partial relief but it was only temporary. No extremity numbness or weakness. No visual changes. No recent head injury.   The history is provided by the patient.  Headache      Past Medical History:  Diagnosis Date  . Asthma   . Headache(784.0)   . HELLP (hemolytic anemia/elev liver enzymes/low platelets in pregnancy)   . HELLP (hemolytic anemia/elev liver enzymes/low platelets in pregnancy)   . Postpartum care following cesarean delivery (4/6) 12/28/2015  . Septate uterus     Patient Active Problem List   Diagnosis Date Noted  . Postpartum care following cesarean delivery (4/6) 12/28/2015  . Abdominal pain in pregnancy, antepartum 12/27/2015  . Headache(784.0) 03/10/2014    Past Surgical History:  Procedure Laterality Date  . CESAREAN SECTION N/A 12/28/2015   Procedure: CESAREAN SECTION;  Surgeon: Olivia Mackie, MD;  Location: [facility];  Service: Obstetrics;  Laterality: N/A;  . CYST EXCISION      OB History    Gravida Para Term Preterm AB Living   1 1 1     1    SAB TAB Ectopic Multiple Live Births         0 1       Home Medications    Prior to Admission medications     Medication Sig Start Date End Date Taking? Authorizing Provider  docusate sodium (COLACE) 100 MG capsule Take 100 mg by mouth daily.    Historical Provider, MD  ibuprofen (ADVIL,MOTRIN) 600 MG tablet Take 1 tablet (600 mg total) by mouth every 6 (six) hours. 12/31/15   Marlinda Mike, CNM  iron polysaccharides (NIFEREX) 150 MG capsule Take 1 capsule (150 mg total) by mouth daily. Twice daily x 2 weeks then once daily 12/31/15   Marlinda Mike, CNM  Magnesium Oxide 250 MG TABS Take 1-2 tablets (250-500 mg total) by mouth daily. 12/31/15   Marlinda Mike, CNM  oxyCODONE-acetaminophen (PERCOCET/ROXICET) 5-325 MG tablet Take 1-2 tablets by mouth every 4 (four) hours as needed for severe pain. 12/31/15   Marlinda Mike, CNM  Prenatal Vit-Fe Fumarate-FA (PRENATAL MULTIVITAMIN) TABS tablet Take 1 tablet by mouth daily at 12 noon.    Historical Provider, MD    Family History Family History  Problem Relation Age of Onset  . Ataxia Neg Hx   . Chorea Neg Hx   . Dementia Neg Hx   . Mental retardation Neg Hx   . Migraines Neg Hx   . Multiple sclerosis Neg Hx   . Neurofibromatosis Neg Hx   . Neuropathy Neg Hx   . Parkinsonism Neg Hx   . Seizures Neg Hx   . Stroke Neg Hx  Social History Social History  Substance Use Topics  . Smoking status: Never Smoker  . Smokeless tobacco: Never Used  . Alcohol use Yes     Comment: very rare      Allergies   Amoxicillin   Review of Systems Review of Systems  Neurological: Positive for headaches.   10 Systems reviewed and are negative for acute change except as noted in the HPI.  Physical Exam Updated Vital Signs BP 104/74 (BP Location: Right Arm)   Pulse 64   Temp 97.6 F (36.4 C) (Oral)   Resp 18   Ht 5\' 3"  (1.6 m)   Wt 133 lb 6.4 oz (60.5 kg)   SpO2 100%   BMI 23.63 kg/m   Physical Exam  Constitutional: She is oriented to person, place, and time. She appears well-developed and well-nourished. No distress.  Uncomfortable, eyes closed, Awake   HENT:  Head: Normocephalic and atraumatic.  Eyes: Conjunctivae and EOM are normal. Pupils are equal, round, and reactive to light.  Neck: Neck supple.  Cardiovascular: Normal rate, regular rhythm and normal heart sounds.   No murmur heard. Pulmonary/Chest: Effort normal and breath sounds normal. No respiratory distress.  Abdominal: Soft. Bowel sounds are normal. She exhibits no distension. There is no tenderness.  Musculoskeletal: She exhibits no edema.  Neurological: She is alert and oriented to person, place, and time. She has normal reflexes. No cranial nerve deficit. She exhibits normal muscle tone.  Fluent speech, normal finger-to-nose testing, negative pronator drift, no clonus 5/5 strength and normal sensation x all 4 extremities  Skin: Skin is warm and dry.  Psychiatric: She has a normal mood and affect. Judgment and thought content normal.  Nursing note and vitals reviewed.    ED Treatments / Results  Labs (all labs ordered are listed, but only abnormal results are displayed) Labs Reviewed  PREGNANCY, URINE    EKG  EKG Interpretation None       Radiology No results found.  Procedures Procedures (including critical care time)  Medications Ordered in ED Medications  sodium chloride 0.9 % bolus 1,000 mL (1,000 mLs Intravenous New Bag/Given 08/03/16 1432)  diphenhydrAMINE (BENADRYL) injection 25 mg (25 mg Intravenous Given 08/03/16 1432)  prochlorperazine (COMPAZINE) injection 10 mg (10 mg Intravenous Given 08/03/16 1435)  magnesium sulfate IVPB 2 g 50 mL (0 g Intravenous Stopped 08/03/16 1522)     Initial Impression / Assessment and Plan / ED Course  I have reviewed the triage vital signs and the nursing notes.  Pertinent labs that were available during my care of the patient were reviewed by me and considered in my medical decision making (see chart for details).  Clinical Course    Pt w/ 2 days of HA, nausea, and photophobia. I reviewed her chart which  shows long-standing history of headaches and she has followed with neurology previously, was previously on daily preventive medication but stopped the medication approximately one year ago. She was nontoxic with normal vital signs. Normal neurologic exam. Gave the patient IV fluids, Benadryl, Compazine, magnesium, Toradol.  The patient denies any neurologic symptoms such as visual changes, focal numbness/weakness, balance problems, confusion, or speech difficulty to suggest a life-threatening intracranial process such as intracranial hemorrhage or mass. Given previous neurologist note describing similar headaches, I do not feel she needs imaging at this time. I have instructed the patient to contact her neurologist to follow-up. Pt will be discharged when headache improved.  Final Clinical Impressions(s) / ED Diagnoses   Final diagnoses:  Acute nonintractable headache, unspecified headache type    New Prescriptions New Prescriptions   No medications on file     Laurence Spatesachel Morgan Little, MD 08/03/16 1610

## 2016-08-03 NOTE — ED Notes (Addendum)
PT IS CURRENTLY BREASTFEEDING HER BABY, WILLING TO "PUMP AND DUMP." Just needs to be informed about any medication given and wether or not safe for baby.

## 2016-08-03 NOTE — ED Notes (Signed)
Pt resting quietly. Eyes closed. Family member at bedside.

## 2016-08-03 NOTE — ED Triage Notes (Signed)
Headache since Thursday, reports nausea with no vomiting.

## 2016-08-30 ENCOUNTER — Ambulatory Visit: Payer: BLUE CROSS/BLUE SHIELD | Admitting: Neurology

## 2016-09-02 ENCOUNTER — Ambulatory Visit: Payer: BLUE CROSS/BLUE SHIELD | Admitting: Neurology

## 2016-09-03 ENCOUNTER — Ambulatory Visit: Payer: BLUE CROSS/BLUE SHIELD | Admitting: Neurology

## 2016-09-05 ENCOUNTER — Encounter: Payer: Self-pay | Admitting: Neurology

## 2016-09-27 ENCOUNTER — Ambulatory Visit: Payer: BLUE CROSS/BLUE SHIELD | Admitting: Neurology

## 2016-10-31 IMAGING — US US MFM OB LIMITED
1 series · 15 of 26 positions shown · non-contrast
Comparison: none

[Series 1: us mfm ob limited · 15 of 26 slices shown]
[im 1/26]
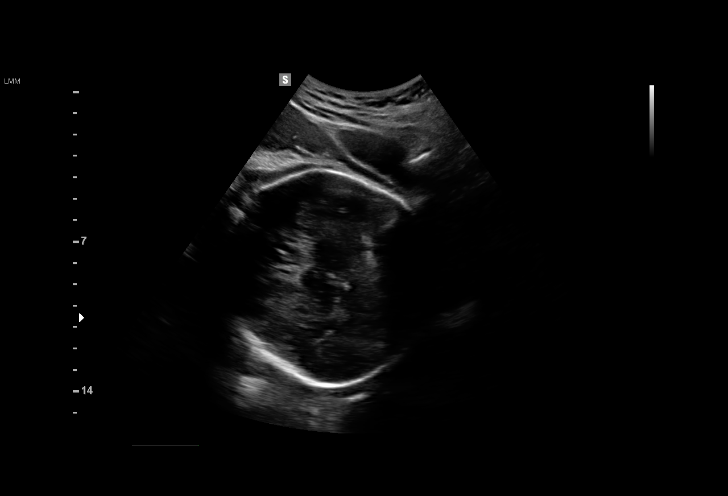
[im 3/26]
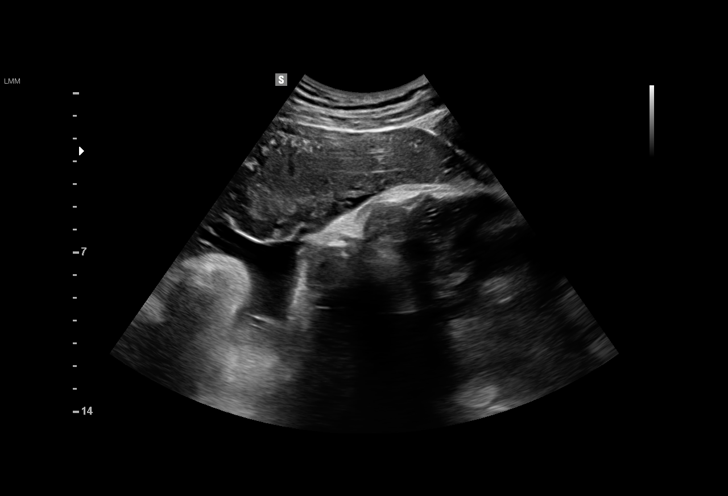
[im 5/26]
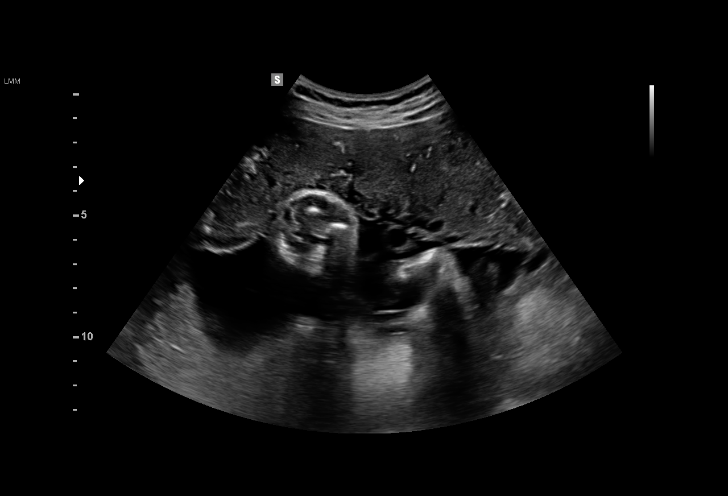
[im 7/26]
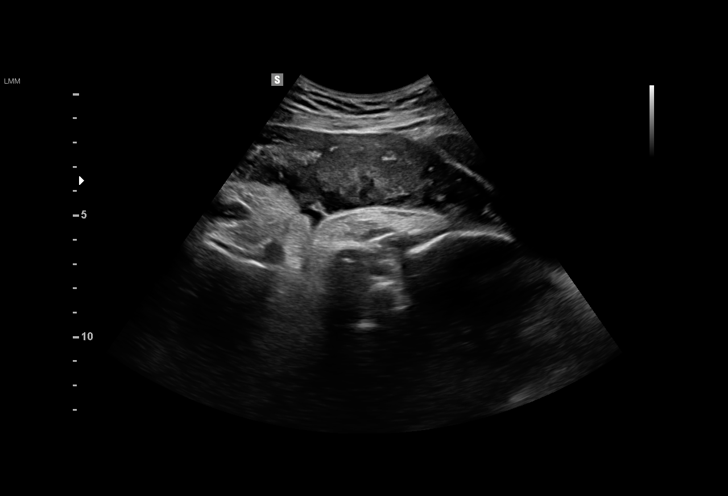
[im 8/26]
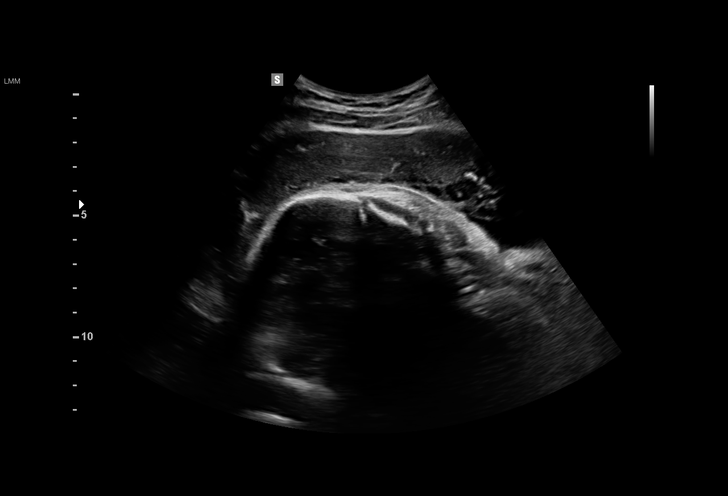
[im 10/26]
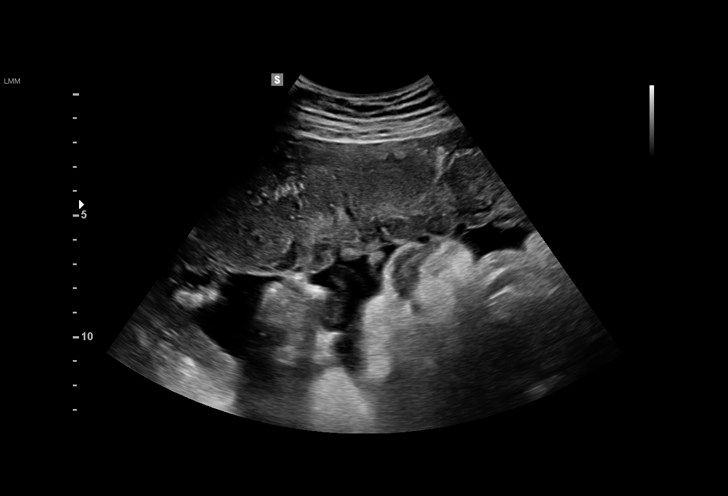
[im 12/26]
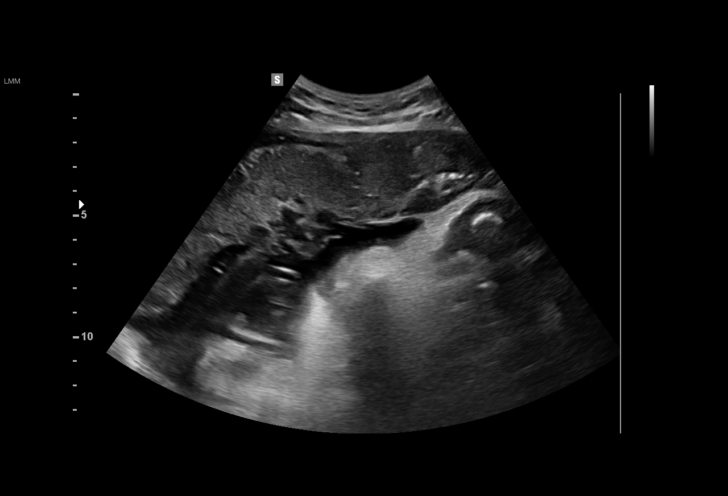
[im 14/26]
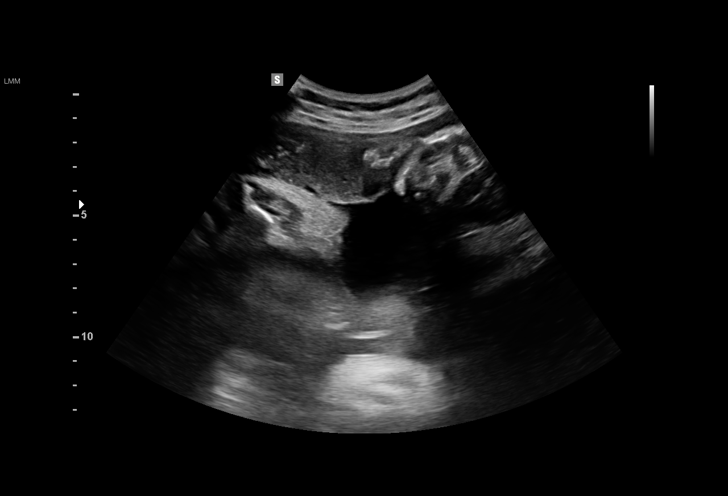
[im 15/26]
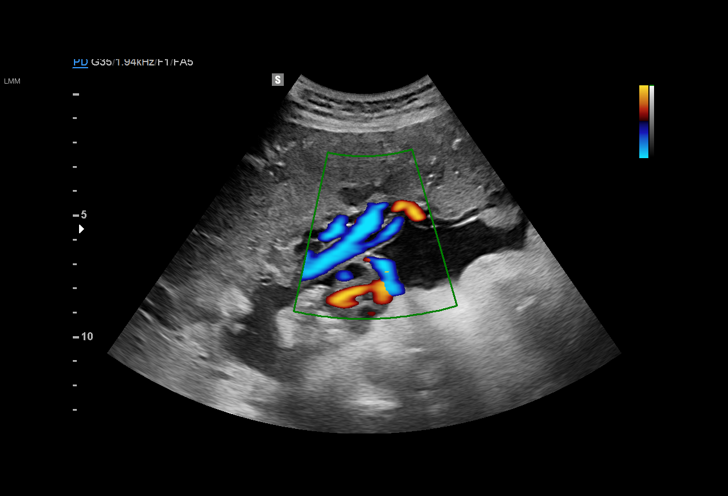
[im 17/26]
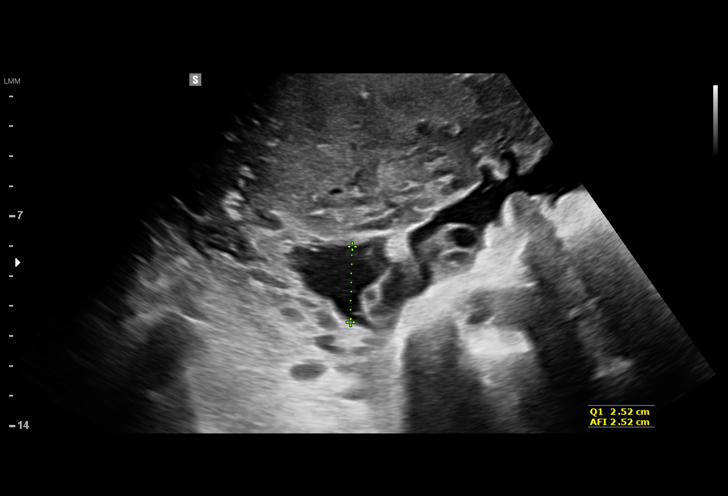
[im 19/26]
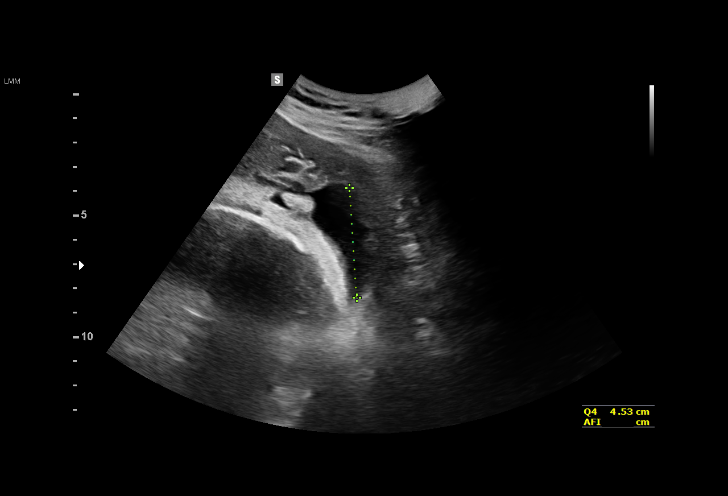
[im 20/26]
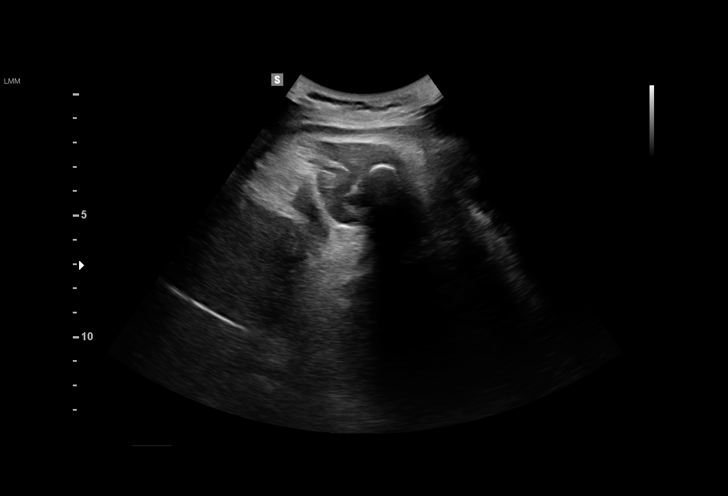
[im 22/26]
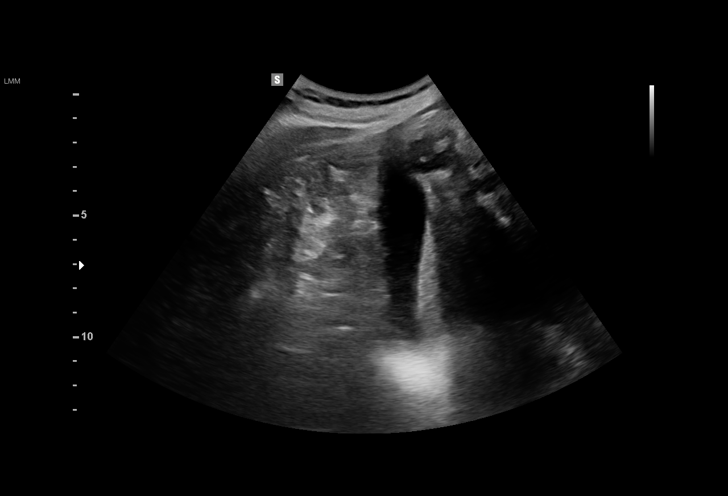
[im 24/26]
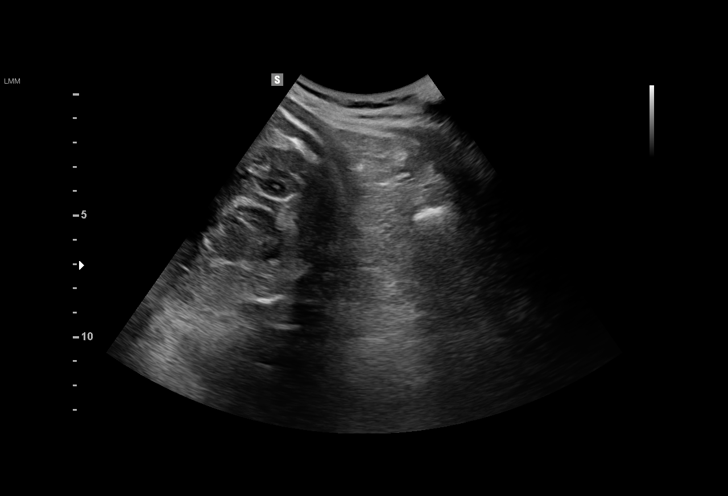
[im 26/26]
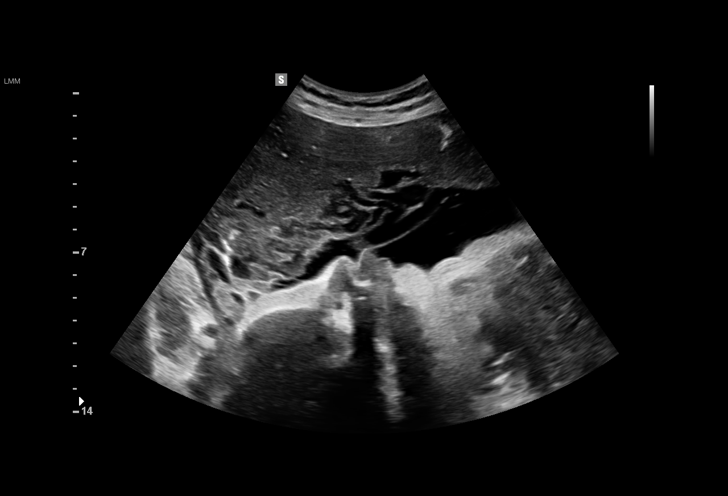

[15 of 26 positions shown; findings below may reference images not displayed]

MAU/Triage

1  MUKHIT SHMIDT             [id number]      [id number]     [id number]
Indications

Abdominal pain in pregnancy
37 weeks gestation of pregnancy
OB History

Gravidity:    1
Fetal Evaluation

Num Of Fetuses:     1
Fetal Heart         140
Rate(bpm):
Cardiac Activity:   Observed
Presentation:       Cephalic
Placenta:           Anterior, above cervical os
P. Cord Insertion:  Visualized, central

Amniotic Fluid
AFI FV:      Subjectively within normal limits
AFI Sum:     11.12    cm      32  %Tile      Larg Pckt:   4.53  cm
RUQ:   4.07    cm   RLQ:    2.52   cm    LUQ:   0       cm    LLQ:   4.53    cm

Comment:    No definite evidence of acute placental abnormality.
Gestational Age

Clinical EDD:  37w 1d                                        EDD:    01/16/16
Best:          37w 1d     Det. By:  Clinical EDD             EDD:    01/16/16
Cervix Uterus Adnexa
Cervix
Not visualized (advanced GA >40wks)

Uterus
No abnormality visualized.

Left Ovary
Not visualized.

Right Ovary
Not visualized.

Cul De Sac:   No free fluid seen.

Adnexa:       No abnormality visualized.  Technically difficult
to evaluate due to stool-filled bowel loops.
Impression

SIUP at 37+1 weeks
Cephalic presentation
Normal amniotic fluid volume
Anterior placenta; no previa; no subchorionic fluid
collections/hemorrhage identified
Recommendations

Follow-up as clinically indicated

## 2017-07-09 LAB — OB RESULTS CONSOLE RUBELLA ANTIBODY, IGM: RUBELLA: IMMUNE

## 2017-07-09 LAB — OB RESULTS CONSOLE GC/CHLAMYDIA
Chlamydia: NEGATIVE
GC PROBE AMP, GENITAL: NEGATIVE

## 2017-07-09 LAB — OB RESULTS CONSOLE RPR: RPR: NONREACTIVE

## 2017-07-09 LAB — OB RESULTS CONSOLE HIV ANTIBODY (ROUTINE TESTING): HIV: NONREACTIVE

## 2017-07-09 LAB — OB RESULTS CONSOLE ABO/RH: RH TYPE: NEGATIVE

## 2017-07-09 LAB — OB RESULTS CONSOLE ANTIBODY SCREEN: ANTIBODY SCREEN: POSITIVE

## 2017-07-09 LAB — OB RESULTS CONSOLE HEPATITIS B SURFACE ANTIGEN: Hepatitis B Surface Ag: NEGATIVE

## 2017-07-19 ENCOUNTER — Encounter (HOSPITAL_COMMUNITY): Payer: Self-pay | Admitting: Certified Nurse Midwife

## 2017-07-19 ENCOUNTER — Inpatient Hospital Stay (HOSPITAL_COMMUNITY): Payer: BLUE CROSS/BLUE SHIELD

## 2017-07-19 ENCOUNTER — Inpatient Hospital Stay (HOSPITAL_COMMUNITY)
Admission: AD | Admit: 2017-07-19 | Discharge: 2017-07-19 | Disposition: A | Discharge status: Home / Self Care | Payer: BLUE CROSS/BLUE SHIELD | Source: Ambulatory Visit | Attending: Obstetrics | Admitting: Obstetrics

## 2017-07-19 DIAGNOSIS — Q512 Other doubling of uterus, unspecified: Secondary | ICD-10-CM | POA: Insufficient documentation

## 2017-07-19 DIAGNOSIS — Z7982 Long term (current) use of aspirin: Secondary | ICD-10-CM | POA: Insufficient documentation

## 2017-07-19 DIAGNOSIS — O1420 HELLP syndrome (HELLP), unspecified trimester: Secondary | ICD-10-CM | POA: Diagnosis not present

## 2017-07-19 DIAGNOSIS — O209 Hemorrhage in early pregnancy, unspecified: Secondary | ICD-10-CM | POA: Diagnosis not present

## 2017-07-19 DIAGNOSIS — O4692 Antepartum hemorrhage, unspecified, second trimester: Secondary | ICD-10-CM | POA: Diagnosis not present

## 2017-07-19 DIAGNOSIS — J45909 Unspecified asthma, uncomplicated: Secondary | ICD-10-CM | POA: Diagnosis not present

## 2017-07-19 DIAGNOSIS — O99511 Diseases of the respiratory system complicating pregnancy, first trimester: Secondary | ICD-10-CM | POA: Insufficient documentation

## 2017-07-19 DIAGNOSIS — Z3A12 12 weeks gestation of pregnancy: Secondary | ICD-10-CM | POA: Insufficient documentation

## 2017-07-19 DIAGNOSIS — O3401 Maternal care for unspecified congenital malformation of uterus, first trimester: Secondary | ICD-10-CM | POA: Insufficient documentation

## 2017-07-19 LAB — CBC WITH DIFFERENTIAL/PLATELET
BASOS ABS: 0 10*3/uL (ref 0.0–0.1)
BASOS PCT: 0 %
EOS ABS: 0.1 10*3/uL (ref 0.0–0.7)
Eosinophils Relative: 1 %
HCT: 33.5 % — ABNORMAL LOW (ref 36.0–46.0)
Hemoglobin: 11.7 g/dL — ABNORMAL LOW (ref 12.0–15.0)
Lymphocytes Relative: 26 %
Lymphs Abs: 2.6 10*3/uL (ref 0.7–4.0)
MCH: 30.5 pg (ref 26.0–34.0)
MCHC: 34.9 g/dL (ref 30.0–36.0)
MCV: 87.5 fL (ref 78.0–100.0)
Monocytes Absolute: 0.6 10*3/uL (ref 0.1–1.0)
Monocytes Relative: 6 %
Neutro Abs: 6.9 10*3/uL (ref 1.7–7.7)
Neutrophils Relative %: 67 %
PLATELETS: 238 10*3/uL (ref 150–400)
RBC: 3.83 MIL/uL — AB (ref 3.87–5.11)
RDW: 12.1 % (ref 11.5–15.5)
WBC: 10.2 10*3/uL (ref 4.0–10.5)

## 2017-07-19 LAB — URINALYSIS, ROUTINE W REFLEX MICROSCOPIC
BILIRUBIN URINE: NEGATIVE
Glucose, UA: NEGATIVE mg/dL
Hgb urine dipstick: NEGATIVE
KETONES UR: NEGATIVE mg/dL
Leukocytes, UA: NEGATIVE
NITRITE: NEGATIVE
PROTEIN: NEGATIVE mg/dL
SPECIFIC GRAVITY, URINE: 1.016 (ref 1.005–1.030)
pH: 6 (ref 5.0–8.0)

## 2017-07-19 LAB — POCT PREGNANCY, URINE: Preg Test, Ur: POSITIVE — AB

## 2017-07-19 MED ORDER — RHO D IMMUNE GLOBULIN 1500 UNIT/2ML IJ SOSY
300.0000 ug | PREFILLED_SYRINGE | Freq: Once | INTRAMUSCULAR | Status: AC
  Administered 2017-07-19: 300 ug via INTRAMUSCULAR
  Filled 2017-07-19: qty 2

## 2017-07-19 NOTE — MAU Note (Signed)
Bleeding since 2340. No pain. Bleeding is bright red and passed clot and ? tissue

## 2017-07-19 NOTE — Discharge Instructions (Signed)
Pelvic Rest °Pelvic rest may be recommended if: °· Your placenta is partially or completely covering the opening of your cervix (placenta previa). °· There is bleeding between the wall of the uterus and the amniotic sac in the first trimester of pregnancy (subchorionic hemorrhage). °· You went into labor too early (preterm labor). ° °Based on your overall health and the health of your baby, your health care provider will decide if pelvic rest is right for you. °How do I rest my pelvis? °For as long as told by your health care provider: °· Do not have sex, sexual stimulation, or an orgasm. °· Do not use tampons. Do not douche. Do not put anything in your vagina. °· Do not lift anything that is heavier than 10 lb (4.5 kg). °· Avoid activities that take a lot of effort (are strenuous). °· Avoid any activity in which your pelvic muscles could become strained. ° °When should I seek medical care? °Seek medical care if you have: °· Cramping pain in your lower abdomen. °· Vaginal discharge. °· A low, dull backache. °· Regular contractions. °· Uterine tightening. ° °When should I seek immediate medical care? °Seek immediate medical care if: °· You have vaginal bleeding and you are pregnant. ° °This information is not intended to replace advice given to you by your health care provider. Make sure you discuss any questions you have with your health care provider. °Document Released: 01/04/2011 Document Revised: 02/15/2016 Document Reviewed: 03/13/2015 °Elsevier Interactive Patient Education © 2018 [employer] ° °Subchorionic Hematoma °A subchorionic hematoma is a gathering of blood between the outer wall of the placenta and the inner wall of the womb (uterus). The placenta is the organ that connects the fetus to the wall of the uterus. The placenta performs the feeding, breathing (oxygen to the fetus), and waste removal (excretory work) of the fetus. °Subchorionic hematoma is the most common abnormality found on a result  from ultrasonography done during the first trimester or early second trimester of pregnancy. If there has been little or no vaginal bleeding, early small hematomas usually shrink on their own and do not affect your baby or pregnancy. The blood is gradually absorbed over 1-2 weeks. When bleeding starts later in pregnancy or the hematoma is larger or occurs in an older pregnant woman, the outcome may not be as good. Larger hematomas may get bigger, which increases the chances for miscarriage. Subchorionic hematoma also increases the risk of premature detachment of the placenta from the uterus, preterm (premature) labor, and stillbirth. °Follow these instructions at home: °· Stay on bed rest if your health care provider recommends this. Although bed rest will not prevent more bleeding or prevent a miscarriage, your health care provider may recommend bed rest until you are advised otherwise. °· Avoid heavy lifting (more than 10 lb [4.5 kg]), exercise, sexual intercourse, or douching as directed by your health care provider. °· Keep track of the number of pads you use each day and how soaked (saturated) they are. Write down this information. °· Do not use tampons. °· Keep all follow-up appointments as directed by your health care provider. Your health care provider may ask you to have follow-up blood tests or ultrasound tests or both. °Get help right away if: °· You have severe cramps in your stomach, back, abdomen, or pelvis. °· You have a fever. °· You pass large clots or tissue. Save any tissue for your health care provider to look at. °· Your bleeding increases or you become lightheaded,   feel weak, or have fainting episodes. °This information is not intended to replace advice given to you by your health care provider. Make sure you discuss any questions you have with your health care provider. °Document Released: 12/25/2006 Document Revised: 02/15/2016 Document Reviewed: 04/08/2013 °Elsevier Interactive Patient  Education © 2017 Elsevier Inc. ° °

## 2017-07-19 NOTE — MAU Provider Note (Signed)
History     CSN: [id number]  Arrival date and time: 07/19/17 4098   First Provider Initiated Contact with Patient 07/19/17 [phone]      Chief Complaint  Patient presents with  . Vaginal Bleeding   HPI [name] is a 26 y.o. G3P1011 at [redacted]w[redacted]d who presents to MAU today with complaint of vaginal bleeding since 2340 today. Her and her husband were about to have intercourse when the bleeding started suddenly. She states that she passed clots and filled up a towel with blood. She states that bleeding is almost gone now. She denies pain. She has a history of previous AB and has a septate uterus.   OB History    Gravida Para Term Preterm AB Living   3 1 1   1 1    SAB TAB Ectopic Multiple Live Births   1     0 1      Past Medical History:  Diagnosis Date  . Asthma   . Headache(784.0)   . HELLP (hemolytic anemia/elev liver enzymes/low platelets in pregnancy)   . HELLP (hemolytic anemia/elev liver enzymes/low platelets in pregnancy)   . Postpartum care following cesarean delivery (4/6) 12/28/2015  . Septate uterus     Past Surgical History:  Procedure Laterality Date  . CESAREAN SECTION N/A 12/28/2015   Procedure: CESAREAN SECTION;  Surgeon: Olivia Mackie, MD;  Location: [facility];  Service: Obstetrics;  Laterality: N/A;  . CYST EXCISION      Family History  Problem Relation Age of Onset  . Ataxia Neg Hx   . Chorea Neg Hx   . Dementia Neg Hx   . Mental retardation Neg Hx   . Migraines Neg Hx   . Multiple sclerosis Neg Hx   . Neurofibromatosis Neg Hx   . Neuropathy Neg Hx   . Parkinsonism Neg Hx   . Seizures Neg Hx   . Stroke Neg Hx     Social History  Substance Use Topics  . Smoking status: Never Smoker  . Smokeless tobacco: Never Used  . Alcohol use Yes     Comment: very rare     Allergies:  Allergies  Allergen Reactions  . Amoxicillin Nausea And Vomiting    Has patient had a PCN reaction causing immediate rash, facial/tongue/throat swelling, SOB or  lightheadedness with hypotension: No Has patient had a PCN reaction causing severe rash involving mucus membranes or skin necrosis: No Has patient had a PCN reaction that required hospitalization No Has patient had a PCN reaction occurring within the last 10 years: No If all of the above answers are "NO", then may proceed with Cephalosporin use.    Prescriptions Prior to Admission  Medication Sig Dispense Refill Last Dose  . aspirin EC 81 MG tablet Take 81 mg by mouth daily.   07/18/2017 at Unknown time  . docusate sodium (COLACE) 100 MG capsule Take 100 mg by mouth daily.   07/18/2017 at Unknown time  . Prenatal Vit-Fe Fumarate-FA (PRENATAL MULTIVITAMIN) TABS tablet Take 1 tablet by mouth daily at 12 noon.   07/18/2017 at Unknown time  . ibuprofen (ADVIL,MOTRIN) 600 MG tablet Take 1 tablet (600 mg total) by mouth every 6 (six) hours. 30 tablet 0   . iron polysaccharides (NIFEREX) 150 MG capsule Take 1 capsule (150 mg total) by mouth daily. Twice daily x 2 weeks then once daily 90 capsule 0   . Magnesium Oxide 250 MG TABS Take 1-2 tablets (250-500 mg total) by mouth daily. 90 tablet  2   . oxyCODONE-acetaminophen (PERCOCET/ROXICET) 5-325 MG tablet Take 1-2 tablets by mouth every 4 (four) hours as needed for severe pain. 30 tablet 0     Review of Systems  Constitutional: Negative for fever.  Gastrointestinal: Negative for abdominal pain.  Genitourinary: Positive for vaginal bleeding. Negative for vaginal discharge.   Physical Exam   Blood pressure 131/80, pulse 86, temperature 98.1 F (36.7 C), resp. rate 18, height 5\' 3"  (1.6 m), weight 138 lb (62.6 kg), last menstrual period 04/26/2017, unknown if currently breastfeeding.  Physical Exam  Nursing note and vitals reviewed. Constitutional: She is oriented to person, place, and time. She appears well-developed and well-nourished. No distress.  HENT:  Head: Normocephalic and atraumatic.  Cardiovascular: Normal rate.   Respiratory: Effort  normal.  GI: Soft. She exhibits no distension and no mass. There is no tenderness. There is no rebound and no guarding.  Genitourinary: Uterus is enlarged (difficult to palpate size secondary to patient discomfort). Uterus is not tender. Cervix exhibits no motion tenderness, no discharge and no friability. Right adnexum displays no mass and no tenderness. Left adnexum displays no mass and no tenderness. There is bleeding (small) in the vagina. No vaginal discharge found.  Neurological: She is alert and oriented to person, place, and time.  Skin: Skin is warm and dry. No erythema.  Psychiatric: She has a normal mood and affect.    Results for orders placed or performed during the hospital encounter of 07/19/17 (from the past 24 hour(s))  Urinalysis, Routine w reflex microscopic     Status: None   Collection Time: 07/19/17 12:40 AM  Result Value Ref Range   Color, Urine YELLOW YELLOW   APPearance CLEAR CLEAR   Specific Gravity, Urine 1.016 1.005 - 1.030   pH 6.0 5.0 - 8.0   Glucose, UA NEGATIVE NEGATIVE mg/dL   Hgb urine dipstick NEGATIVE NEGATIVE   Bilirubin Urine NEGATIVE NEGATIVE   Ketones, ur NEGATIVE NEGATIVE mg/dL   Protein, ur NEGATIVE NEGATIVE mg/dL   Nitrite NEGATIVE NEGATIVE   Leukocytes, UA NEGATIVE NEGATIVE  Pregnancy, urine POC     Status: Abnormal   Collection Time: 07/19/17 12:53 AM  Result Value Ref Range   Preg Test, Ur POSITIVE (A) NEGATIVE  CBC with Differential/Platelet     Status: Abnormal   Collection Time: 07/19/17 12:58 AM  Result Value Ref Range   WBC 10.2 4.0 - 10.5 K/uL   RBC 3.83 (L) 3.87 - 5.11 MIL/uL   Hemoglobin 11.7 (L) 12.0 - 15.0 g/dL   HCT 46.933.5 (L) 62.936.0 - 52.846.0 %   MCV 87.5 78.0 - 100.0 fL   MCH 30.5 26.0 - 34.0 pg   MCHC 34.9 30.0 - 36.0 g/dL   RDW 41.312.1 24.411.5 - 01.015.5 %   Platelets 238 150 - 400 K/uL   Neutrophils Relative % 67 %   Neutro Abs 6.9 1.7 - 7.7 K/uL   Lymphocytes Relative 26 %   Lymphs Abs 2.6 0.7 - 4.0 K/uL   Monocytes Relative 6 %    Monocytes Absolute 0.6 0.1 - 1.0 K/uL   Eosinophils Relative 1 %   Eosinophils Absolute 0.1 0.0 - 0.7 K/uL   Basophils Relative 0 %   Basophils Absolute 0.0 0.0 - 0.1 K/uL  Rh IG workup (includes ABO/Rh)     Status: None (Preliminary result)   Collection Time: 07/19/17 12:58 AM  Result Value Ref Range   Gestational Age(Wks) 12    ABO/RH(D) A NEG    Antibody Screen  NEG    Unit Number [id number]    Blood Component Type RHIG    Unit division 00    Status of Unit ISSUED    Transfusion Status OK TO TRANSFUSE     MAU Course  Procedures None  MDM [place] today  CBC and Rhogam work-up ordered Rhogam given today  Discussed with Dr. Ernestina Penna. Ok for discharge at this time. Patient should follow-up in the office in 2 weeks.  Assessment and Plan  A: SIUP at [redacted]w[redacted]d Vaginal bleeding in pregnancy prior to [redacted] weeks gestation   P: Discharge home Bleeding precautions and pelvic rest discussed Patient advised to follow-up with [facility] in 2 weeks. She should call for an appointment.  Patient may return to MAU as needed or if her condition were to change or worsen  Vonzella Nipple, PA-C 07/19/2017, 2:17 AM

## 2017-07-20 LAB — RH IG WORKUP (INCLUDES ABO/RH)
ABO/RH(D): A NEG
Antibody Screen: NEGATIVE
GESTATIONAL AGE(WKS): 12
Unit division: 0

## 2017-08-16 ENCOUNTER — Encounter (HOSPITAL_COMMUNITY): Payer: Self-pay | Admitting: Emergency Medicine

## 2017-08-16 ENCOUNTER — Other Ambulatory Visit: Payer: Self-pay

## 2017-08-16 ENCOUNTER — Ambulatory Visit (HOSPITAL_COMMUNITY)
Admission: EM | Admit: 2017-08-16 | Discharge: 2017-08-16 | Disposition: A | Discharge status: Home / Self Care | Payer: BLUE CROSS/BLUE SHIELD | Attending: Internal Medicine | Admitting: Internal Medicine

## 2017-08-16 ENCOUNTER — Telehealth (HOSPITAL_COMMUNITY): Payer: Self-pay | Admitting: *Deleted

## 2017-08-16 DIAGNOSIS — O3402 Maternal care for unspecified congenital malformation of uterus, second trimester: Secondary | ICD-10-CM | POA: Insufficient documentation

## 2017-08-16 DIAGNOSIS — Z7982 Long term (current) use of aspirin: Secondary | ICD-10-CM | POA: Diagnosis not present

## 2017-08-16 DIAGNOSIS — O99512 Diseases of the respiratory system complicating pregnancy, second trimester: Secondary | ICD-10-CM | POA: Diagnosis not present

## 2017-08-16 DIAGNOSIS — O26892 Other specified pregnancy related conditions, second trimester: Secondary | ICD-10-CM | POA: Diagnosis not present

## 2017-08-16 DIAGNOSIS — J45909 Unspecified asthma, uncomplicated: Secondary | ICD-10-CM | POA: Diagnosis not present

## 2017-08-16 DIAGNOSIS — Q512 Other doubling of uterus, unspecified: Secondary | ICD-10-CM | POA: Insufficient documentation

## 2017-08-16 DIAGNOSIS — R109 Unspecified abdominal pain: Secondary | ICD-10-CM | POA: Diagnosis not present

## 2017-08-16 DIAGNOSIS — Z79899 Other long term (current) drug therapy: Secondary | ICD-10-CM | POA: Diagnosis not present

## 2017-08-16 DIAGNOSIS — Z3A16 16 weeks gestation of pregnancy: Secondary | ICD-10-CM | POA: Diagnosis not present

## 2017-08-16 DIAGNOSIS — Z880 Allergy status to penicillin: Secondary | ICD-10-CM | POA: Diagnosis not present

## 2017-08-16 DIAGNOSIS — R300 Dysuria: Secondary | ICD-10-CM | POA: Insufficient documentation

## 2017-08-16 LAB — POCT URINALYSIS DIP (DEVICE)
Bilirubin Urine: NEGATIVE
Glucose, UA: NEGATIVE mg/dL
Ketones, ur: NEGATIVE mg/dL
LEUKOCYTES UA: NEGATIVE
NITRITE: NEGATIVE
PH: 7 (ref 5.0–8.0)
PROTEIN: NEGATIVE mg/dL
SPECIFIC GRAVITY, URINE: 1.02 (ref 1.005–1.030)
UROBILINOGEN UA: 0.2 mg/dL (ref 0.0–1.0)

## 2017-08-16 NOTE — ED Triage Notes (Signed)
Pt c/o pressure, burning with urination, hx of UTI and feels the same.

## 2017-08-16 NOTE — Discharge Instructions (Signed)
Since you are already on antibiotics which have not helped your symptoms we are unable to process an accurate culture. Therefore we will have you stop your antibiotics, and return for a urine sample in 48 hours in order to culture it to ensure you are adequately treated. If you have increased pain, bleeding, fevers develop [facility] Please continue to follow with your OBGYN as well due to persistent spotting.

## 2017-08-16 NOTE — ED Provider Notes (Signed)
[facility]    CSN: [id number] Arrival date & time: 08/16/17  1920     History   Chief Complaint Chief Complaint  Patient presents with  . Dysuria    HPI Joyce Copaara Schrimpf is a 26 y.o. female.   Delice Bisonara presents with complaints of generally ill feeling which started approximately 6 days ago and has persisted. She states she feels a pelvic pressure and pain which is worse with urination. She states this feels very similar to previous UTI's she has had. She called her doctor and was started on antibiotics three days ago, but feels her symptoms have not improved. She is [redacted] weeks pregnant. She states she has vaginal spotting, which she has had since she was seen in the hospital for this 10/27 for hemorrhage. She states the spotting had mildly improved for a few days but returned yesterday. She has been drinking fluids. She is unsure of any vaginal discharge. Denies back pain nausea fevers or vomiting. Next ob appointment is in approximately 2 weeks. Rates pain 2/10 currently and is to the pelvic region.    ROS per HPI.       Past Medical History:  Diagnosis Date  . Asthma   . Headache(784.0)   . HELLP (hemolytic anemia/elev liver enzymes/low platelets in pregnancy)   . HELLP (hemolytic anemia/elev liver enzymes/low platelets in pregnancy)   . Postpartum care following cesarean delivery (4/6) 12/28/2015  . Septate uterus     Patient Active Problem List   Diagnosis Date Noted  . Postpartum care following cesarean delivery (4/6) 12/28/2015  . Abdominal pain in pregnancy, antepartum 12/27/2015  . Headache(784.0) 03/10/2014    Past Surgical History:  Procedure Laterality Date  . CESAREAN SECTION N/A 12/28/2015   Procedure: CESAREAN SECTION;  Surgeon: Olivia Mackieichard Taavon, MD;  Location: [facility];  Service: Obstetrics;  Laterality: N/A;  . CYST EXCISION      OB History    Gravida Para Term Preterm AB Living   3 1 1   1 1    SAB TAB Ectopic Multiple Live Births   1     0 1        Home Medications    Prior to Admission medications   Medication Sig Start Date End Date Taking? Authorizing Provider  aspirin EC 81 MG tablet Take 81 mg by mouth daily.   Yes [provider]  docusate sodium (COLACE) 100 MG capsule Take 100 mg by mouth daily.   Yes [provider]  Prenatal Vit-Fe Fumarate-FA (PRENATAL MULTIVITAMIN) TABS tablet Take 1 tablet by mouth daily at 12 noon.   Yes [provider]  Zinc Sulfate (ZINC 15 PO) Take by mouth.   Yes [provider]  iron polysaccharides (NIFEREX) 150 MG capsule Take 1 capsule (150 mg total) by mouth daily. Twice daily x 2 weeks then once daily 12/31/15   Marlinda MikeBailey, Tanya, CNM  Magnesium Oxide 250 MG TABS Take 1-2 tablets (250-500 mg total) by mouth daily. 12/31/15   Marlinda MikeBailey, Tanya, CNM    Family History Family History  Problem Relation Age of Onset  . Ataxia Neg Hx   . Chorea Neg Hx   . Dementia Neg Hx   . Mental retardation Neg Hx   . Migraines Neg Hx   . Multiple sclerosis Neg Hx   . Neurofibromatosis Neg Hx   . Neuropathy Neg Hx   . Parkinsonism Neg Hx   . Seizures Neg Hx   . Stroke Neg Hx  Social History Social History   Tobacco Use  . Smoking status: Never Smoker  . Smokeless tobacco: Never Used  Substance Use Topics  . Alcohol use: Yes    Comment: very rare   . Drug use: No     Allergies   Amoxicillin   Review of Systems Review of Systems   Physical Exam Triage Vital Signs ED Triage Vitals  Enc Vitals Group     BP 08/16/17 1959 135/80     Pulse Rate 08/16/17 1959 73     Resp 08/16/17 1959 16     Temp 08/16/17 1959 98.4 F (36.9 C)     Temp src --      SpO2 08/16/17 1959 100 %     Weight --      Height --      Head Circumference --      Peak Flow --      Pain Score 08/16/17 2000 2     Pain Loc --      Pain Edu? --      Excl. in GC? --    No data found.  Updated Vital Signs BP 135/80   Pulse 73   Temp 98.4 F (36.9 C)   Resp 16   LMP 04/26/2017    SpO2 100%   Visual Acuity Right Eye Distance:   Left Eye Distance:   Bilateral Distance:    Right Eye Near:   Left Eye Near:    Bilateral Near:     Physical Exam  Constitutional: She is oriented to person, place, and time. She appears well-developed and well-nourished. No distress.  Cardiovascular: Normal rate, regular rhythm and normal heart sounds.  Pulmonary/Chest: Effort normal and breath sounds normal.  Abdominal: Soft. There is tenderness in the suprapubic area. There is no rigidity, no rebound, no guarding and no CVA tenderness.  Genitourinary: Cervix exhibits discharge. Vaginal discharge found.  Genitourinary Comments: Brown discharge noted; small amount of bleeding from cervix s/p pelvic exam  Neurological: She is alert and oriented to person, place, and time.  Skin: Skin is warm and dry.  Vitals reviewed.    UC Treatments / Results  Labs (all labs ordered are listed, but only abnormal results are displayed) Labs Reviewed  POCT URINALYSIS DIP (DEVICE) - Abnormal; Notable for the following components:      Result Value   Hgb urine dipstick MODERATE (*)    All other components within normal limits  CERVICOVAGINAL ANCILLARY ONLY    EKG  EKG Interpretation None       Radiology No results found.  Procedures Procedures (including critical care time)  Medications Ordered in UC Medications - No data to display   Initial Impression / Assessment and Plan / UC Course  I have reviewed the triage vital signs and the nursing notes.  Pertinent labs & imaging results that were available during my care of the patient were reviewed by me and considered in my medical decision making (see chart for details).     Afebrile, without tachycardia or tachypnea. Stable for discharge at this time. Her symptoms have not improved despite being on macrobid for three days. She endorses symptoms consistent with previous UTI's. Recommended stopping antibiotics, return in 48 hours  for urine sample to culture. Patient still with persistent spotting while pregnant. This is not new and has been to the hospital and to her OB for follow up for this. However, it has persisted and next appointment isn't for 2 weeks, and now with  new symptoms. Recommended follow up on Monday with OB for recheck due to persistent spotting. Vaginal swab colleted today, will notify if positive findings. Return precautions provided. Patient verbalized understanding and agreeable to plan.    Final Clinical Impressions(s) / UC Diagnoses   Final diagnoses:  Dysuria    ED Discharge Orders    None       Controlled Substance Prescriptions [place] Controlled Substance Registry consulted? Not Applicable   Georgetta Haber, NP 08/16/17 2139

## 2017-08-16 NOTE — ED Triage Notes (Signed)
Pt is pregnant [redacted] weeks.

## 2017-08-18 LAB — CERVICOVAGINAL ANCILLARY ONLY
BACTERIAL VAGINITIS: NEGATIVE
CANDIDA VAGINITIS: NEGATIVE

## 2017-09-23 NOTE — L&D Delivery Note (Signed)
Delivery Note  First Stage: Labor onset: 01/26/18 @ 2:30pm with SROM  Augmentation : nipple stimulation, AROM of forebag and IV Pitocin Analgesia /Anesthesia intrapartum: nitrous oxide and epidural  SROM at 01/26/18 @ 2:30pm, AROM of forebag @ 2254pm  Second Stage: Complete dilation at 0745AM Onset of pushing at 0750AM FHR second stage Category 2: Baseline 150-155 bpm/moderate variability/+accels/ variable decelerations to a nadir of 120 bpm with pushing and good return to baseline  VBAC, spontaneous delivery of a viable female "Avril" at [phone] by Carlean Jews, CNM in ROA position No nuchal cord Cord double clamped after cessation of pulsation, cut by FOB Cord blood sample collected   Third Stage: Placenta delivered via Schultz intact with 3 VC @ [phone] Placenta disposition: pt. To take placenta home with her Uterine tone firm with massage and IV Pitocin bolus / bleeding - minimal Significant perineal/labial edema noted  Third degree laceration and left sulcus tear - see Dr. Elpidio Eric note for repair details.  Est. Blood Loss (mL):  Mom to postpartum.  Baby to Couplet care / Skin to Skin.  Newborn: Birth Weight: 7#8.6oz (3420g) Apgar Scores: 8, 9 Feeding planned: breast  Carlean Jews, MSN, CNM [facility]

## 2018-01-08 LAB — OB RESULTS CONSOLE GBS: GBS: NEGATIVE

## 2018-01-11 ENCOUNTER — Inpatient Hospital Stay (HOSPITAL_COMMUNITY)
Admission: AD | Admit: 2018-01-11 | Discharge: 2018-01-12 | Disposition: A | Discharge status: Home / Self Care | Payer: BLUE CROSS/BLUE SHIELD | Source: Ambulatory Visit | Attending: Obstetrics and Gynecology | Admitting: Obstetrics and Gynecology

## 2018-01-11 ENCOUNTER — Encounter (HOSPITAL_COMMUNITY): Payer: Self-pay

## 2018-01-11 DIAGNOSIS — Z3A00 Weeks of gestation of pregnancy not specified: Secondary | ICD-10-CM | POA: Diagnosis not present

## 2018-01-11 DIAGNOSIS — Z3490 Encounter for supervision of normal pregnancy, unspecified, unspecified trimester: Secondary | ICD-10-CM | POA: Diagnosis not present

## 2018-01-11 DIAGNOSIS — O479 False labor, unspecified: Secondary | ICD-10-CM

## 2018-01-11 MED ORDER — NALBUPHINE HCL 10 MG/ML IJ SOLN
10.0000 mg | INTRAMUSCULAR | Status: DC | PRN
  Administered 2018-01-11: 10 mg via SUBCUTANEOUS
  Filled 2018-01-11: qty 1

## 2018-01-11 NOTE — MAU Note (Signed)
Pt reporting contractions that started at 5pm. Pt has not timed them. Pt denies LOF or vaginal bleeding. Reports good fetal movement. States cervix was 2cm last week.

## 2018-01-12 ENCOUNTER — Encounter (HOSPITAL_COMMUNITY): Payer: Self-pay

## 2018-01-12 DIAGNOSIS — Z3490 Encounter for supervision of normal pregnancy, unspecified, unspecified trimester: Secondary | ICD-10-CM | POA: Diagnosis not present

## 2018-01-12 NOTE — Progress Notes (Signed)
Pt feeling extremely anxious about ligament pain on lower right and contractions.  Was not able to tolerate cervical exam; patient not contracting regularly. Called Marlinda Mikeanya Bailey, the midwife with report on this patient.  Per the midwife I explained to patient that we can give her something for pain and monitor contractions to see if they become more regular.   She initially declined the pain medication and asked me to "wait and let her think about it." Checked back in with her and she said she still was "confused" about having the ligament pain and wondering what she should do at home. Said the contractions are becoming more irregular.    Patient spoke to her doula and Marlinda Mikeanya Bailey. Received reassurance from Kenney Housemananya that should come come back to MAU and/or to the office if her contractions get more painful/regular or if ligament pain persists.  The midwife said that the office would call patient in the morning and get her into the office for an appointment if needed.   She asked for "time to think about it."Patient agreed to try Nubain. Reassessed her pain and gave her more reassurance that she can come back to MAU or see the midwife in office tomorrow. Patient indicated understanding of all discharge instructions. Patient left with husband.

## 2018-01-12 NOTE — Discharge Instructions (Signed)
Braxton Hicks Contractions °Contractions of the uterus can occur throughout pregnancy, but they are not always a sign that you are in labor. You may have practice contractions called Braxton Hicks contractions. These false labor contractions are sometimes confused with true labor. °What are Braxton Hicks contractions? °Braxton Hicks contractions are tightening movements that occur in the muscles of the uterus before labor. Unlike true labor contractions, these contractions do not result in opening (dilation) and thinning of the cervix. Toward the end of pregnancy (32-34 weeks), Braxton Hicks contractions can happen more often and may become stronger. These contractions are sometimes difficult to tell apart from true labor because they can be very uncomfortable. You should not feel embarrassed if you go to the hospital with false labor. °Sometimes, the only way to tell if you are in true labor is for your health care provider to look for changes in the cervix. The health care provider will do a physical exam and may monitor your contractions. If you are not in true labor, the exam should show that your cervix is not dilating and your water has not broken. °If there are other health problems associated with your pregnancy, it is completely safe for you to be sent home with false labor. You may continue to have Braxton Hicks contractions until you go into true labor. °How to tell the difference between true labor and false labor °True labor °· Contractions last 30-70 seconds. °· Contractions become very regular. °· Discomfort is usually felt in the top of the uterus, and it spreads to the lower abdomen and low back. °· Contractions do not go away with walking. °· Contractions usually become more intense and increase in frequency. °· The cervix dilates and gets thinner. °False labor °· Contractions are usually shorter and not as strong as true labor contractions. °· Contractions are usually irregular. °· Contractions  are often felt in the front of the lower abdomen and in the groin. °· Contractions may go away when you walk around or change positions while lying down. °· Contractions get weaker and are shorter-lasting as time goes on. °· The cervix usually does not dilate or become thin. °Follow these instructions at home: °· Take over-the-counter and prescription medicines only as told by your health care provider. °· Keep up with your usual exercises and follow other instructions from your health care provider. °· Eat and drink lightly if you think you are going into labor. °· If Braxton Hicks contractions are making you uncomfortable: °? Change your position from lying down or resting to walking, or change from walking to resting. °? Sit and rest in a tub of warm water. °? Drink enough fluid to keep your urine pale yellow. Dehydration may cause these contractions. °? Do slow and deep breathing several times an hour. °· Keep all follow-up prenatal visits as told by your health care provider. This is important. °Contact a health care provider if: °· You have a fever. °· You have continuous pain in your abdomen. °Get help right away if: °· Your contractions become stronger, more regular, and closer together. °· You have fluid leaking or gushing from your vagina. °· You pass blood-tinged mucus (bloody show). °· You have bleeding from your vagina. °· You have low back pain that you never had before. °· You feel your baby’s head pushing down and causing pelvic pressure. °· Your baby is not moving inside you as much as it used to. °Summary °· Contractions that occur before labor are called Braxton   Hicks contractions, false labor, or practice contractions. °· Braxton Hicks contractions are usually shorter, weaker, farther apart, and less regular than true labor contractions. True labor contractions usually become progressively stronger and regular and they become more frequent. °· Manage discomfort from Braxton Hicks contractions by  changing position, resting in a warm bath, drinking plenty of water, or practicing deep breathing. °This information is not intended to replace advice given to you by your health care provider. Make sure you discuss any questions you have with your health care provider. °Document Released: 01/23/2017 Document Revised: 01/23/2017 Document Reviewed: 01/23/2017 °Elsevier Interactive Patient Education © 2018 [employer] ° °

## 2018-01-26 ENCOUNTER — Inpatient Hospital Stay (HOSPITAL_COMMUNITY)
Admission: AD | Admit: 2018-01-26 | Discharge: 2018-01-28 | DRG: 768 | Disposition: A | Discharge status: Home / Self Care | Payer: BLUE CROSS/BLUE SHIELD | Source: Ambulatory Visit | Attending: Obstetrics and Gynecology | Admitting: Obstetrics and Gynecology

## 2018-01-26 ENCOUNTER — Encounter (HOSPITAL_COMMUNITY): Payer: Self-pay

## 2018-01-26 DIAGNOSIS — Z3A39 39 weeks gestation of pregnancy: Secondary | ICD-10-CM | POA: Diagnosis not present

## 2018-01-26 DIAGNOSIS — O34219 Maternal care for unspecified type scar from previous cesarean delivery: Secondary | ICD-10-CM

## 2018-01-26 DIAGNOSIS — O26893 Other specified pregnancy related conditions, third trimester: Secondary | ICD-10-CM | POA: Diagnosis present

## 2018-01-26 DIAGNOSIS — O9902 Anemia complicating childbirth: Secondary | ICD-10-CM | POA: Diagnosis present

## 2018-01-26 DIAGNOSIS — Z98891 History of uterine scar from previous surgery: Secondary | ICD-10-CM

## 2018-01-26 DIAGNOSIS — D649 Anemia, unspecified: Secondary | ICD-10-CM | POA: Diagnosis present

## 2018-01-26 DIAGNOSIS — O4292 Full-term premature rupture of membranes, unspecified as to length of time between rupture and onset of labor: Principal | ICD-10-CM | POA: Diagnosis present

## 2018-01-26 DIAGNOSIS — O26899 Other specified pregnancy related conditions, unspecified trimester: Secondary | ICD-10-CM

## 2018-01-26 DIAGNOSIS — Z6791 Unspecified blood type, Rh negative: Secondary | ICD-10-CM

## 2018-01-26 DIAGNOSIS — Z880 Allergy status to penicillin: Secondary | ICD-10-CM

## 2018-01-26 LAB — COMPREHENSIVE METABOLIC PANEL
ALT: 16 U/L (ref 14–54)
AST: 21 U/L (ref 15–41)
Albumin: 3.5 g/dL (ref 3.5–5.0)
Alkaline Phosphatase: 166 U/L — ABNORMAL HIGH (ref 38–126)
Anion gap: 9 (ref 5–15)
BILIRUBIN TOTAL: 0.2 mg/dL — AB (ref 0.3–1.2)
BUN: 10 mg/dL (ref 6–20)
CHLORIDE: 103 mmol/L (ref 101–111)
CO2: 22 mmol/L (ref 22–32)
Calcium: 9 mg/dL (ref 8.9–10.3)
Creatinine, Ser: 0.72 mg/dL (ref 0.44–1.00)
GFR calc Af Amer: >60 mL/min (ref >60)
GLUCOSE: 97 mg/dL (ref 65–99)
POTASSIUM: 5.1 mmol/L (ref 3.5–5.1)
Sodium: 134 mmol/L — ABNORMAL LOW (ref 135–145)
Total Protein: 7.4 g/dL (ref 6.5–8.1)

## 2018-01-26 LAB — CBC
HEMATOCRIT: 35.8 % — AB (ref 36.0–46.0)
Hemoglobin: 12.2 g/dL (ref 12.0–15.0)
MCH: 30.1 pg (ref 26.0–34.0)
MCHC: 34.1 g/dL (ref 30.0–36.0)
MCV: 88.4 fL (ref 78.0–100.0)
Platelets: 212 10*3/uL (ref 150–400)
RBC: 4.05 MIL/uL (ref 3.87–5.11)
RDW: 15.8 % — AB (ref 11.5–15.5)
WBC: 12.5 10*3/uL — ABNORMAL HIGH (ref 4.0–10.5)

## 2018-01-26 LAB — LACTATE DEHYDROGENASE: LDH: 148 U/L (ref 98–192)

## 2018-01-26 LAB — URIC ACID: Uric Acid, Serum: 5.6 mg/dL (ref 2.3–6.6)

## 2018-01-26 MED ORDER — LACTATED RINGERS IV SOLN
INTRAVENOUS | Status: DC
  Administered 2018-01-27: 02:00:00 via INTRAVENOUS

## 2018-01-26 MED ORDER — LACTATED RINGERS IV SOLN
500.0000 mL | INTRAVENOUS | Status: DC | PRN
  Administered 2018-01-27: 1000 mL via INTRAVENOUS

## 2018-01-26 MED ORDER — OXYTOCIN BOLUS FROM INFUSION
500.0000 mL | Freq: Once | INTRAVENOUS | Status: AC
  Administered 2018-01-27: 500 mL via INTRAVENOUS

## 2018-01-26 MED ORDER — OXYTOCIN 40 UNITS IN LACTATED RINGERS INFUSION - SIMPLE MED
2.5000 [IU]/h | INTRAVENOUS | Status: DC
  Filled 2018-01-26: qty 1000

## 2018-01-26 MED ORDER — ONDANSETRON HCL 4 MG/2ML IJ SOLN: 4.0000 mg | Freq: Four times a day (QID) | INTRAMUSCULAR | Status: DC | PRN

## 2018-01-26 MED ORDER — LIDOCAINE HCL (PF) 1 % IJ SOLN
30.0000 mL | INTRAMUSCULAR | Status: DC | PRN
  Administered 2018-01-27: 30 mL via SUBCUTANEOUS
  Filled 2018-01-26: qty 30

## 2018-01-26 MED ORDER — INSULIN GLARGINE 100 UNIT/ML ~~LOC~~ SOLN: 18.0000 [IU] | Freq: Every day | SUBCUTANEOUS | Status: DC

## 2018-01-26 MED ORDER — SOD CITRATE-CITRIC ACID 500-334 MG/5ML PO SOLN: 30.0000 mL | ORAL | Status: DC | PRN

## 2018-01-26 MED ORDER — ACETAMINOPHEN 325 MG PO TABS: 650.0000 mg | ORAL_TABLET | ORAL | Status: DC | PRN

## 2018-01-26 NOTE — H&P (Addendum)
OB ADMISSION/ HISTORY & PHYSICAL:  Admission Date: 01/26/2018  3:49 PM  Admit Diagnosis: 39+2 weeks, SROM, previous c/s planning TOLAC  Brianna Robinson is a 27 y.o. female G3P1011 at 39+2 weeks presenting for early labor with spontaneous rupture of membranes at 2:32pm today for clear fluid. She is a previous cesarean section for pre-eclampsia and HELLP, remote from delivery and did not labor.  She is planning a trial of labor after cesarean section.   Prenatal History: G3P1011   EDC : 01/31/2018, by Last Menstrual Period  Prenatal care at [facility] since 10 weeks Primary Ob Provider: Wiliam Ke, CNM  Prenatal course complicated by  - Previous LTCS for severe pre-eclampsia and HELLP at 37 weeks (unfavorable cervix, did not have a trial of labor) - Possible septate uterus: however, prior OP 12/2015 note states no evidence of uterine spetum or anomaly - RH negative  - Hx. Of MAB, was on Prometrium until 12 weeks - Family hx. Of cardiac defect (VSD and atresia) - normal fetal echo  [facility] with first and second trimester vaginal bleeding: resolved   Prenatal Labs: ABO, Rh: --/--/A NEG (10/27 0058) Antibody: NEG (10/27 0058) Rubella:   Immune RPR:   non-reactive HBsAg:   negative HIV:   negative GTT: 126 GBS:   Negative Declined flu and Tdap  CUS: normal anatomy, female, posterior placenta   Medical / Surgical History :  Past medical history:  Past Medical History:  Diagnosis Date  . Asthma   . Headache(784.0)   . HELLP (hemolytic anemia/elev liver enzymes/low platelets in pregnancy)   . HELLP (hemolytic anemia/elev liver enzymes/low platelets in pregnancy)   . Postpartum care following cesarean delivery (4/6) 12/28/2015  . Septate uterus      Past surgical history:  Past Surgical History:  Procedure Laterality Date  . CESAREAN SECTION N/A 12/28/2015   Procedure: CESAREAN SECTION;  Surgeon: Olivia Mackie, MD;  Location: [facility];  Service: Obstetrics;  Laterality: N/A;  . CYST  EXCISION      Family History:  Family History  Problem Relation Age of Onset  . Ataxia Neg Hx   . Chorea Neg Hx   . Dementia Neg Hx   . Mental retardation Neg Hx   . Migraines Neg Hx   . Multiple sclerosis Neg Hx   . Neurofibromatosis Neg Hx   . Neuropathy Neg Hx   . Parkinsonism Neg Hx   . Seizures Neg Hx   . Stroke Neg Hx      Social History:  reports that she has never smoked. She has never used smokeless tobacco. She reports that she drinks alcohol. She reports that she does not use drugs.   Allergies: Amoxicillin    Current Medications at time of admission:  Prior to Admission medications   Medication Sig Start Date End Date Taking? Authorizing Provider  aspirin EC 81 MG tablet Take 81 mg by mouth daily.    [provider]  docusate sodium (COLACE) 100 MG capsule Take 100 mg by mouth daily.    [provider]  doxylamine, Sleep, (UNISOM) 25 MG tablet Take 25 mg by mouth at bedtime as needed.    [provider]  iron polysaccharides (NIFEREX) 150 MG capsule Take 1 capsule (150 mg total) by mouth daily. Twice daily x 2 weeks then once daily 12/31/15   Marlinda Mike, CNM  Magnesium Oxide 250 MG TABS Take 1-2 tablets (250-500 mg total) by mouth daily. 12/31/15   Marlinda Mike, CNM  Prenatal  Vit-Fe Fumarate-FA (PRENATAL MULTIVITAMIN) TABS tablet Take 1 tablet by mouth daily at 12 noon.    [provider]  Zinc Sulfate (ZINC 15 PO) Take by mouth.    [provider]     Review of Systems: Active FM onset of ctx @ 2:30pm currently every 3-5 minutes LOF  / SROM @ 2:30pm for clear fluid bloody show present   Physical Exam:  VS: Temperature 98 F (36.7 C), temperature source Axillary, resp. rate 18, last menstrual period 04/26/2017, unknown if currently breastfeeding.  General: alert and oriented, appears slightly anxious and nervous about process Heart: RRR Lungs: Clear lung fields Abdomen: Gravid, soft and non-tender,  non-distended / uterus: gravid, non-tender Extremities: no edema  Genitalia / VE: Dilation: 2.5 Exam by:: Corgan Mormile  FHR: baseline rate 140 bpm / variability moderate / accelerations + 15x15 / no decelerations TOCO: 3-5 minutes/moderate-strong  Assessment: 39+[redacted] weeks gestation Early 1st stage of labor FHR category 1 GBS negative  TOLAC   Plan:  1. Admit to [employer]   - Routine labor and delivery orders   - Pain management: nitrous oxide   - Declined IV site; blood draw initial OB labs   - Continuous fetal monitoring 2. GBS Negative   - No prophylaxis indicated 3. Postpartum:   - Breast 4. Anticipatory goal VBAC   Dr. Ernestina Penna notified of admission / plan of care  Carlean Jews, MSN, [facility]

## 2018-01-26 NOTE — Anesthesia Pain Management Evaluation Note (Signed)
  CRNA Pain Management Visit Note  Patient: Brianna Robinson, 27 y.o., female  "Hello I am a member of the anesthesia team at [facility]. We have an anesthesia team available at all times to provide care throughout the hospital, including epidural management and anesthesia for C-section. I don't know your plan for the delivery whether it a natural birth, water birth, IV sedation, nitrous supplementation, doula or epidural, but we want to meet your pain goals."   1.Was your pain managed to your expectations on prior hospitalizations?   Yes   2.What is your expectation for pain management during this hospitalization?     Labor support without medications and Nitrous Oxide  3.How can we help you reach that goal? Nursing interventions.  Record the patient's initial score and the patient's pain goal.   Pain: 6  Pain Goal: 6 [facility] wants you to be able to say your pain was always managed very well.  Madilyn Cephas 01/26/2018

## 2018-01-27 ENCOUNTER — Inpatient Hospital Stay (HOSPITAL_COMMUNITY): Payer: BLUE CROSS/BLUE SHIELD | Admitting: Anesthesiology

## 2018-01-27 ENCOUNTER — Encounter (HOSPITAL_COMMUNITY): Payer: Self-pay | Admitting: *Deleted

## 2018-01-27 DIAGNOSIS — O34219 Maternal care for unspecified type scar from previous cesarean delivery: Secondary | ICD-10-CM

## 2018-01-27 LAB — PROTEIN / CREATININE RATIO, URINE
Creatinine, Urine: 40 mg/dL
Total Protein, Urine: <6 mg/dL

## 2018-01-27 LAB — RPR: RPR Ser Ql: NONREACTIVE

## 2018-01-27 MED ORDER — PHENYLEPHRINE 40 MCG/ML (10ML) SYRINGE FOR IV PUSH (FOR BLOOD PRESSURE SUPPORT)
80.0000 ug | PREFILLED_SYRINGE | INTRAVENOUS | Status: DC | PRN
  Filled 2018-01-27: qty 5

## 2018-01-27 MED ORDER — OXYTOCIN 40 UNITS IN LACTATED RINGERS INFUSION - SIMPLE MED
1.0000 m[IU]/min | INTRAVENOUS | Status: DC
  Administered 2018-01-27: 2 m[IU]/min via INTRAVENOUS

## 2018-01-27 MED ORDER — IBUPROFEN 600 MG PO TABS
600.0000 mg | ORAL_TABLET | Freq: Once | ORAL | Status: AC
  Administered 2018-01-27: 600 mg via ORAL
  Filled 2018-01-27: qty 1

## 2018-01-27 MED ORDER — SIMETHICONE 80 MG PO CHEW: 80.0000 mg | CHEWABLE_TABLET | ORAL | Status: DC | PRN

## 2018-01-27 MED ORDER — ONDANSETRON HCL 4 MG/2ML IJ SOLN: 4.0000 mg | INTRAMUSCULAR | Status: DC | PRN

## 2018-01-27 MED ORDER — TERBUTALINE SULFATE 1 MG/ML IJ SOLN
0.2500 mg | Freq: Once | INTRAMUSCULAR | Status: DC | PRN
  Filled 2018-01-27: qty 1

## 2018-01-27 MED ORDER — ONDANSETRON HCL 4 MG PO TABS: 4.0000 mg | ORAL_TABLET | ORAL | Status: DC | PRN

## 2018-01-27 MED ORDER — FENTANYL 2.5 MCG/ML BUPIVACAINE 1/10 % EPIDURAL INFUSION (WH - ANES)
14.0000 mL/h | INTRAMUSCULAR | Status: DC | PRN
  Administered 2018-01-27 (×2): 14 mL/h via EPIDURAL
  Filled 2018-01-27 (×2): qty 100

## 2018-01-27 MED ORDER — COCONUT OIL OIL
1.0000 "application " | TOPICAL_OIL | Status: DC | PRN
  Administered 2018-01-28: 1 via TOPICAL
  Filled 2018-01-27: qty 120

## 2018-01-27 MED ORDER — BENZOCAINE-MENTHOL 20-0.5 % EX AERO
1.0000 "application " | INHALATION_SPRAY | CUTANEOUS | Status: DC | PRN
  Administered 2018-01-27 – 2018-01-28 (×2): 1 via TOPICAL
  Filled 2018-01-27 (×2): qty 56

## 2018-01-27 MED ORDER — DIBUCAINE 1 % RE OINT: 1.0000 "application " | TOPICAL_OINTMENT | RECTAL | Status: DC | PRN

## 2018-01-27 MED ORDER — ACETAMINOPHEN 325 MG PO TABS
650.0000 mg | ORAL_TABLET | ORAL | Status: DC | PRN
  Administered 2018-01-28: 650 mg via ORAL
  Filled 2018-01-27: qty 2

## 2018-01-27 MED ORDER — DOCUSATE SODIUM 100 MG PO CAPS
100.0000 mg | ORAL_CAPSULE | Freq: Two times a day (BID) | ORAL | Status: DC
  Administered 2018-01-28 (×2): 100 mg via ORAL
  Filled 2018-01-27 (×2): qty 1

## 2018-01-27 MED ORDER — OXYCODONE-ACETAMINOPHEN 5-325 MG PO TABS: 2.0000 | ORAL_TABLET | ORAL | Status: DC | PRN

## 2018-01-27 MED ORDER — EPHEDRINE 5 MG/ML INJ
10.0000 mg | INTRAVENOUS | Status: DC | PRN
  Filled 2018-01-27: qty 2

## 2018-01-27 MED ORDER — LACTATED RINGERS IV SOLN: 500.0000 mL | Freq: Once | INTRAVENOUS | Status: DC

## 2018-01-27 MED ORDER — ZOLPIDEM TARTRATE 5 MG PO TABS
5.0000 mg | ORAL_TABLET | Freq: Every evening | ORAL | Status: DC | PRN
Start: 2018-01-27 — End: 2018-01-28

## 2018-01-27 MED ORDER — PRENATAL MULTIVITAMIN CH
1.0000 | ORAL_TABLET | Freq: Every day | ORAL | Status: DC
  Administered 2018-01-27 – 2018-01-28 (×2): 1 via ORAL
  Filled 2018-01-27 (×2): qty 1

## 2018-01-27 MED ORDER — DIPHENHYDRAMINE HCL 50 MG/ML IJ SOLN: 12.5000 mg | INTRAMUSCULAR | Status: DC | PRN

## 2018-01-27 MED ORDER — PHENYLEPHRINE 40 MCG/ML (10ML) SYRINGE FOR IV PUSH (FOR BLOOD PRESSURE SUPPORT)
80.0000 ug | PREFILLED_SYRINGE | INTRAVENOUS | Status: DC | PRN
  Filled 2018-01-27: qty 5
  Filled 2018-01-27: qty 10

## 2018-01-27 MED ORDER — OXYCODONE-ACETAMINOPHEN 5-325 MG PO TABS
1.0000 | ORAL_TABLET | ORAL | Status: DC | PRN
  Filled 2018-01-27 (×2): qty 1

## 2018-01-27 MED ORDER — DIPHENHYDRAMINE HCL 25 MG PO CAPS
25.0000 mg | ORAL_CAPSULE | Freq: Four times a day (QID) | ORAL | Status: DC | PRN
Start: 2018-01-27 — End: 2018-01-28

## 2018-01-27 MED ORDER — SODIUM CHLORIDE 0.9 % IV SOLN
3.0000 g | Freq: Once | INTRAVENOUS | Status: AC
  Administered 2018-01-27: 3 g via INTRAVENOUS
  Filled 2018-01-27: qty 3

## 2018-01-27 MED ORDER — SENNOSIDES-DOCUSATE SODIUM 8.6-50 MG PO TABS
2.0000 | ORAL_TABLET | ORAL | Status: DC
  Administered 2018-01-27: 2 via ORAL
  Filled 2018-01-27 (×2): qty 2

## 2018-01-27 MED ORDER — IBUPROFEN 600 MG PO TABS
600.0000 mg | ORAL_TABLET | Freq: Four times a day (QID) | ORAL | Status: DC
  Administered 2018-01-27 – 2018-01-28 (×4): 600 mg via ORAL
  Filled 2018-01-27 (×5): qty 1

## 2018-01-27 MED ORDER — WITCH HAZEL-GLYCERIN EX PADS
1.0000 | MEDICATED_PAD | CUTANEOUS | Status: DC | PRN
Start: 2018-01-27 — End: 2018-01-28

## 2018-01-27 MED ORDER — LIDOCAINE HCL (PF) 1 % IJ SOLN
INTRAMUSCULAR | Status: DC | PRN
  Administered 2018-01-27 (×2): 5 mL via EPIDURAL
  Administered 2018-01-27: 10 mL

## 2018-01-27 NOTE — Anesthesia Postprocedure Evaluation (Signed)
Anesthesia Post Note  Patient: Brianna Robinson  Procedure(s) Performed: AN AD HOC LABOR EPIDURAL     Patient location during evaluation: Mother Baby Anesthesia Type: Epidural Level of consciousness: awake and alert and oriented Pain management: satisfactory to patient Vital Signs Assessment: post-procedure vital signs reviewed and stable Respiratory status: spontaneous breathing and nonlabored ventilation Cardiovascular status: stable Postop Assessment: no headache, no backache, no signs of nausea or vomiting, adequate PO intake and patient able to bend at knees (patient up walking) Anesthetic complications: no    Last Vitals:  Vitals:   01/27/18 1241 01/27/18 1353  BP: 103/71 127/87  Pulse: 72 89  Resp: 18 20  Temp: 37.1 C 36.9 C  SpO2:      Last Pain:  Vitals:   01/27/18 1353  TempSrc: Oral  PainSc:    Pain Goal:                 [name]

## 2018-01-27 NOTE — Progress Notes (Signed)
S:  Pt. Able to rest some for the past hour.  Feeling much more comfortable with epidural.  Reports some intermittent rectal pressure with contractions. Feeling anxious about delivery and wants to talk through pressure, pushing, post delivery.   O:  VS: Blood pressure 121/78, pulse 82, temperature 98.7 F (37.1 C), temperature source Oral, resp. rate 18, height  (1.6 m), last menstrual period 04/26/2017, SpO2 100 %, unknown if currently breastfeeding.        FHR : baseline 150 bpm / variability moderate / accelerations + / occasional early and variable decelerations        Toco: contractions every 3-6 minutes / moderate        Cervix : Dilation: 9 Effacement (%): 90 Station: 0 Presentation: Vertex Exam by:: Sigmon CNM        Membranes: moderate MSAF   A: Active labor     FHR category 2     GBS Negative     TOLAC  P: Pt. Desires nipple stimulation to help with uterine contractions - will try 15 minutes on each side     Continuous labor support from doula, spouse, CNM, and RN     Anticipate VBAC    Carlean Jews, MSN, CNM [facility]

## 2018-01-27 NOTE — Anesthesia Preprocedure Evaluation (Signed)
Anesthesia Evaluation  Patient identified by MRN, date of birth, ID band Patient awake    Reviewed: Allergy & Precautions, H&P , NPO status , Patient's Chart, lab work & pertinent test results  History of Anesthesia Complications Negative for: history of anesthetic complications  Airway Mallampati: II  TM Distance: >3 FB Neck ROM: full    Dental no notable dental hx. (+) Teeth Intact   Pulmonary neg pulmonary ROS,    Pulmonary exam normal breath sounds clear to auscultation       Cardiovascular hypertension, negative cardio ROS Normal cardiovascular exam Rhythm:regular Rate:Normal     Neuro/Psych negative neurological ROS  negative psych ROS   GI/Hepatic negative GI ROS, Neg liver ROS,   Endo/Other  negative endocrine ROS  Renal/GU negative Renal ROS  negative genitourinary   Musculoskeletal   Abdominal   Peds  Hematology negative hematology ROS (+)   Anesthesia Other Findings   Reproductive/Obstetrics (+) Pregnancy                             Anesthesia Physical Anesthesia Plan  ASA: II  Anesthesia Plan: Epidural   Post-op Pain Management:    Induction:   PONV Risk Score and Plan:   Airway Management Planned:   Additional Equipment:   Intra-op Plan:   Post-operative Plan:   Informed Consent: I have reviewed the patients History and Physical, chart, labs and discussed the procedure including the risks, benefits and alternatives for the proposed anesthesia with the patient or authorized representative who has indicated his/her understanding and acceptance.     Plan Discussed with:   Anesthesia Plan Comments:         Anesthesia Quick Evaluation  

## 2018-01-27 NOTE — Progress Notes (Signed)
Late entry note S:  Pt. Requested epidural 0030 - epidural placed at 0100; pt. Received no relief and a second epidural was placed. Pt. Very anxious about labor and pushing; having difficulty relaxing due to some discomfort and involuntary shaking. Pt. Tearful at times   O:  VS: Blood pressure 125/77, pulse 78, temperature 97.9 F (36.6 C), temperature source Oral, resp. rate 20, height  (1.6 m), last menstrual period 04/26/2017, SpO2 100 %, unknown if currently breastfeeding.        FHR : baseline 145 bpm / variability moderate / accelerations + / early decelerations with each contraction        Toco: contractions every 3-6 minutes / moderate        Cervix : Dilation: 6.5 Effacement (%): 90 Station: 0 Presentation: Vertex Exam by:: Sigmon CNM        Membranes: moderate meconium with bloody show   A: Active labor     FHR category 1     GBS Negative     TOLAC  P: Continuous labor support from spouse, doula, CNM and RN to help calm anxious feelings and fears     Epidural PCA encouraged      Encouraged alternating exaggerated sims position with peanut ball      May need Pitocin for augmentation      Working towards VBAC   Carlean Jews, MSN, CNM [facility]

## 2018-01-27 NOTE — Progress Notes (Signed)
Called to see patient after successful vaginal birth after cesarean section for evaluation of obstructive repair.  Patient with epidural anesthesia. Vaginal birth and delivery of the placenta done by midwife. Assessment of the obstetrical laceration revealed partial thickness third degree tear through the external anal sphincter. Rectal mucosa and anterior anal sphincter intact. Right and left sulcal extensions. Lateral wall defect on the patient's right with bulging of the underlying tissue. Right and left irregular labial minora lacerations.  Findings discussed with patient. Plan for repair reviewed. 3 g of Unasyn given after confirming patient's amoxicillin allergy was nausea. Lidocaine infused into the repair site. Rectal exam was done to confirm intact rectal mucosa.   Allis clamps were placed on the right and left external anal sphincter edges. 3 separate 2-0 Vicryl sutures were thrown in a posterior inferior and superior fashion. All sutures were thrown and then tied in that order. A 2-0 Vicryl was used to close the left sulcal extension. A separate 2-0 Vicryl was used to close the right sulcal extension with taking deeper bites into the underlying connective tissue to close the bulging defect. An additional 3-0 Vicryl suture was used to close the obstetrical laceration, greater than 4 cm in length. A running locked suture was used up until the introitus. This suture was then thrown under the introitus and an unlocked continuous suture was thrown to the apex of the incision which was then carried back up in a subcuticular fashion. 3-0 interrupted Vicryl sutures were used to repair the left and right labial minora defects.  Patient tolerated well. No active bleeding at the end plate repair. Good approximation of tissues.  Brianna Robinson 01/27/2018 2:25 PM

## 2018-01-27 NOTE — Plan of Care (Signed)
RN at bedside with CNM and doula, position changes, birthing ball, nitrous oxide, counterpressure and encouragement used to promote natural pain control and normal progression of labor. Trying to control pain with epidural now, will continue to monitor.

## 2018-01-27 NOTE — Progress Notes (Signed)
S:  Feeling intermittent rectal pressure.  Did nipple stimulation for 15 minutes on each breast with electric pump.  Colostrum collected from each side.  Discussed cervical exam and pt. Agrees.  Discussed contractions still spaced out and possible need for Pitocin augmentation and she agrees.   O:  VS: Blood pressure 109/77, pulse 90, temperature 98.3 F (36.8 C), temperature source Oral, resp. rate 18, height  (1.6 m), last menstrual period 04/26/2017, SpO2 100 %, unknown if currently breastfeeding.        FHR : baseline 145 bpm / variability moderate / accelerations + / no decelerations        Toco: contractions every 6-7 minutes / moderate        Cervix : Dilation: 9 Effacement (%): 90 Station: 0 Presentation: Vertex Exam by:: Ameira Alessandrini CNM        Membranes: ruptured moderate MSAF  A: Protracted active labor     FHR category 1  P: Begin Pitocin at 2 milliunits and increase by 2 milliunits; do not go above 6 milliunits      Continuous labor support with doula, CNM, spouse, and mother     Anticipate VBAC    Carlean Jews, MSN, CNM [facility]

## 2018-01-27 NOTE — Anesthesia Procedure Notes (Signed)
Epidural Patient location during procedure: OB  Staffing Anesthesiologist: Keniah Klemmer, MD Performed: anesthesiologist   Preanesthetic Checklist Completed: patient identified, site marked, surgical consent, pre-op evaluation, timeout performed, IV checked, risks and benefits discussed and monitors and equipment checked  Epidural Patient position: sitting Prep: DuraPrep Patient monitoring: heart rate, continuous pulse ox and blood pressure Approach: right paramedian Location: L3-L4 Injection technique: LOR saline  Needle:  Needle type: Tuohy  Needle gauge: 17 G Needle length: 9 cm and 9 Needle insertion depth: 5 cm Catheter type: closed end flexible Catheter size: 20 Guage Catheter at skin depth: 9 cm Test dose: negative  Assessment Events: blood not aspirated, injection not painful, no injection resistance, negative IV test and no paresthesia  Additional Notes Patient identified. Risks/Benefits/Options discussed with patient including but not limited to bleeding, infection, nerve damage, paralysis, failed block, incomplete pain control, headache, blood pressure changes, nausea, vomiting, reactions to medication both or allergic, itching and postpartum back pain. Confirmed with bedside nurse the patient's most recent platelet count. Confirmed with patient that they are not currently taking any anticoagulation, have any bleeding history or any family history of bleeding disorders. Patient expressed understanding and wished to proceed. All questions were answered. Sterile technique was used throughout the entire procedure. Please see nursing notes for vital signs. Test dose was given through epidural needle and negative prior to continuing to dose epidural or start infusion. Warning signs of high block given to the patient including shortness of breath, tingling/numbness in hands, complete motor block, or any concerning symptoms with instructions to call for help. Patient was given  instructions on fall risk and not to get out of bed. All questions and concerns addressed with instructions to call with any issues.     

## 2018-01-27 NOTE — Lactation Note (Signed)
This note was copied from a baby's chart. Lactation Consultation Note  Patient Name: Brianna Robinson Today's Date: 01/27/2018 Reason for consult: Initial assessment;1st time breastfeeding;Term  This is a G3P2 mother whose infant is now 41 hours old.  Mother did not breastfeed her first child who is now 28 years old but did pump and feed for 11 months.  Mother was getting ready to feed baby when I entered.  I offered to assist and she willingly agreed.  Mother prefers the cradle position, however, I suggested to latch in the cross cradle and then move into the cradle position.  This worked well for her.  Mothers breasts are soft and non tender and nipples are erect with no trauma.  Assisted to latch baby on the left breast.  After a couple of attempts she was sucking well with rhythmic jaw movements.  A few audible sucks were noted.  Encouraged mother to continue feeding 8-12 times in 24 hours or earlier if she shows feeding cues.  Reviewed breast massage and hand expression.  Colostrum drops easy to express.    Mom made aware of O/P services, breastfeeding support groups, community resources, and our phone # for post-discharge questions. FOB in room sleeping on couch.  Mother will call for assistance as needed.  Maternal Data Formula Feeding for Exclusion: No Has patient been taught Hand Expression?: Yes Does the patient have breastfeeding experience prior to this delivery?: No  Feeding Feeding Type: Breast Fed Length of feed: 10 min(still feeding)  LATCH Score Latch: Grasps breast easily, tongue down, lips flanged, rhythmical sucking.  Audible Swallowing: A few with stimulation  Type of Nipple: Everted at rest and after stimulation  Comfort (Breast/Nipple): Soft / non-tender  Hold (Positioning): Assistance needed to correctly position infant at breast and maintain latch.  LATCH Score: 8  Interventions Interventions: Breast feeding basics reviewed;Assisted with latch;Skin to  skin;Breast massage;Hand express;Position options;Support pillows;Adjust position;Breast compression  Lactation Tools Discussed/Used     Consult Status Consult Status: Follow-up Date: 01/28/18 Follow-up type: In-patient    Brianna Robinson 01/27/2018, 8:27 PM

## 2018-01-27 NOTE — Progress Notes (Signed)
Late entry progress note S:  Pt. Progressing well; alternating positions and using nitrous oxide PRN with some relief during contractions.  She denies continuous LOF and asked for cervical exam at 2255pm.  Forebag noted; and we discussed options for rupturing forebag. She continued to work throught contractions and agreed to rupture at 2345pm.    O:  VS: Blood pressure 136/89, pulse 78, temperature 98.2 F (36.8 C), temperature source Axillary, resp. rate 20, height  (1.6 m), last menstrual period 04/26/2017, SpO2 100 %, unknown if currently breastfeeding.        FHR : baseline 135 bpm  / variability moderate / accelerations +15x15 / no decelerations        Toco: contractions every 3-4 minutes  minutes / moderate-strong        Cervix : Dilation: 5.5 Effacement (%): 90 Station: 0 Presentation: Vertex Exam by:: Remy Voiles CNM        Membranes: forebag ruptured for moderate meconium   A: Transitioning to active labor     FHR category 1     GBS negative     TOLAC  P: continue rotating positions     Nitrous oxide PRN      Continuous labor support from husband and doula; and CNM      We are working toward VBAC    Carlean Jews, MSN, [facility]

## 2018-01-28 ENCOUNTER — Other Ambulatory Visit: Payer: Self-pay

## 2018-01-28 LAB — CBC
HCT: 28.3 % — ABNORMAL LOW (ref 36.0–46.0)
HEMOGLOBIN: 9.3 g/dL — AB (ref 12.0–15.0)
MCH: 30.4 pg (ref 26.0–34.0)
MCHC: 32.9 g/dL (ref 30.0–36.0)
MCV: 92.5 fL (ref 78.0–100.0)
Platelets: 198 10*3/uL (ref 150–400)
RBC: 3.06 MIL/uL — AB (ref 3.87–5.11)
RDW: 16.5 % — ABNORMAL HIGH (ref 11.5–15.5)
WBC: 12.5 10*3/uL — ABNORMAL HIGH (ref 4.0–10.5)

## 2018-01-28 MED ORDER — IBUPROFEN 600 MG PO TABS: 600.0000 mg | ORAL_TABLET | Freq: Four times a day (QID) | ORAL | 0 refills | Status: DC

## 2018-01-28 MED ORDER — RHO D IMMUNE GLOBULIN 1500 UNIT/2ML IJ SOSY
300.0000 ug | PREFILLED_SYRINGE | Freq: Once | INTRAMUSCULAR | Status: AC
  Administered 2018-01-28: 300 ug via INTRAVENOUS
  Filled 2018-01-28: qty 2

## 2018-01-28 MED ORDER — BENZOCAINE-MENTHOL 20-0.5 % EX AERO: 1.0000 "application " | INHALATION_SPRAY | CUTANEOUS | Status: DC | PRN

## 2018-01-28 MED ORDER — DOCUSATE SODIUM 100 MG PO CAPS: 100.0000 mg | ORAL_CAPSULE | Freq: Two times a day (BID) | ORAL | 0 refills | Status: AC

## 2018-01-28 MED ORDER — COCONUT OIL OIL: 1.0000 "application " | TOPICAL_OIL | 0 refills | Status: DC | PRN

## 2018-01-28 MED ORDER — ACETAMINOPHEN 325 MG PO TABS: 650.0000 mg | ORAL_TABLET | ORAL | Status: DC | PRN

## 2018-01-28 NOTE — Discharge Summary (Signed)
Obstetric Discharge Summary Reason for Admission: onset of labor and rupture of membranes Prenatal Procedures: ultrasound Intrapartum Procedures: spontaneous vaginal delivery and VBAC, epidural Postpartum Procedures: none Complications-Operative and Postpartum: 3rd degree perineal laceration Hemoglobin  Date Value Ref Range Status  01/28/2018 9.3 (L) 12.0 - 15.0 g/dL Final    Comment:    DELTA CHECK NOTED REPEATED TO VERIFY    HCT  Date Value Ref Range Status  01/28/2018 28.3 (L) 36.0 - 46.0 % Final    Physical Exam:  General: alert, cooperative and no distress Lochia: appropriate Uterine Fundus: firm Incision: healing well DVT Evaluation: No significant calf/ankle edema.  Discharge Diagnoses: Term Pregnancy-delivered and anemia  Discharge Information: Date: 01/28/2018 Activity: pelvic rest Diet: routine Medications:  Allergies as of 01/28/2018      Reactions   Amoxicillin Nausea And Vomiting   Has patient had a PCN reaction causing immediate rash, facial/tongue/throat swelling, SOB or lightheadedness with hypotension: No Has patient had a PCN reaction causing severe rash involving mucus membranes or skin necrosis: No Has patient had a PCN reaction that required hospitalization No Has patient had a PCN reaction occurring within the last 10 years: No If all of the above answers are "NO", then may proceed with Cephalosporin use.      Medication List    STOP taking these medications   aspirin EC 81 MG tablet   doxylamine (Sleep) 25 MG tablet Commonly known as:  UNISOM     TAKE these medications   acetaminophen 325 MG tablet Commonly known as:  TYLENOL Take 2 tablets (650 mg total) by mouth every 4 (four) hours as needed (for pain scale < 4).   benzocaine-Menthol 20-0.5 % Aero Commonly known as:  DERMOPLAST Apply 1 application topically as needed for irritation (perineal discomfort).   coconut oil Oil Apply 1 application topically as needed.   docusate sodium  100 MG capsule Commonly known as:  COLACE Take 1 capsule (100 mg total) by mouth 2 (two) times daily.   ibuprofen 600 MG tablet Commonly known as:  ADVIL,MOTRIN Take 1 tablet (600 mg total) by mouth every 6 (six) hours.   Iron 325 (65 Fe) MG Tabs Take 3 tablets by mouth daily.   Magnesium Oxide 250 MG Tabs Take 1-2 tablets (250-500 mg total) by mouth daily. What changed:  how much to take   pantoprazole 20 MG tablet Commonly known as:  PROTONIX Take 1 tablet by mouth daily.   prenatal multivitamin Tabs tablet Take 1 tablet by mouth daily at 12 noon.            Discharge Care Instructions  (From admission, onward)        Start     Ordered   01/28/18 0000  Discharge wound care:    Comments:  Sitz baths 2 times /day with warm water x 1 week   01/28/18 1614     Condition: stable Instructions: refer to practice specific booklet Discharge to: home Follow-up Information    Karena Addison, CNM. Schedule an appointment as soon as possible for a visit in 6 week(s).   Specialty:  Obstetrics and Gynecology Contact information: [address] [place] [address] [phone]           Newborn Data: Live born female "Avril" Birth Weight: 7 lb 8.6 oz (3420 g) APGAR: 8, 9  Newborn Delivery   Birth date/time:  01/27/2018 09:17:00 Delivery type:  VBAC, Spontaneous     Home with mother.  Neta Mends, CNM  01/28/2018, 4:15 PM

## 2018-01-28 NOTE — Progress Notes (Signed)
Patient meeting PP milemarkers, desires DC home today, newborn d/ced by Peds. Reviewed PP instructions w/ patient F/U at [facility] in 6 wks.

## 2018-01-28 NOTE — Progress Notes (Signed)
D/c instructions discussed, signed, & given.  Reviewed prescription meds: indication, action, & frequency.  [facility] d/c packet given.  Pt & husband verb understanding after all questions answered; ready for d/c.  Pt to notify staff when ready to be escorted out.

## 2018-01-28 NOTE — Progress Notes (Signed)
Post Partum Day 1 Subjective: no complaints, up ad lib, voiding, tolerating PO and + flatus  Objective: Blood pressure (!) 95/59, pulse 62, temperature 98.6 F (37 C), temperature source Oral, resp. rate 18, height  (1.6 m), last menstrual period 04/26/2017, SpO2 100 %, unknown if currently breastfeeding.  Physical Exam:  General: alert, cooperative and appears stated age Lochia: appropriate Uterine Fundus: firm Incision: na DVT Evaluation: No evidence of DVT seen on physical exam. No cords or calf tenderness.  Recent Labs    01/26/18 2003 01/28/18 0600  HGB 12.2 9.3*  HCT 35.8* 28.3*    Assessment/Plan: Stable PPD #1 s/p VBAC Third degree laceration Plan for discharge tomorrow and Breastfeeding   LOS: 2 days   Derrick Orris J 01/28/2018, 9:35 AM

## 2018-01-29 LAB — RH IG WORKUP (INCLUDES ABO/RH)
ABO/RH(D): A NEG
Fetal Screen: NEGATIVE
GESTATIONAL AGE(WKS): 39.3
Unit division: 0

## 2018-01-30 LAB — TYPE AND SCREEN
ABO/RH(D): A NEG
ANTIBODY SCREEN: POSITIVE
UNIT DIVISION: 0
Unit division: 0

## 2018-01-30 LAB — BPAM RBC
BLOOD PRODUCT EXPIRATION DATE: 201906042359
Blood Product Expiration Date: 201906032359
Unit Type and Rh: 9500
Unit Type and Rh: 9500

## 2018-05-24 IMAGING — US US OB COMP LESS 14 WK
1 series · 15 of 26 positions shown · non-contrast
Comparison: None.

CLINICAL DATA: Positive pregnancy test. Vaginal bleeding. Known
septate uterus.

EXAM:
OBSTETRIC <14 WK ULTRASOUND
TECHNIQUE: Transabdominal ultrasound was performed for evaluation of the
gestation as well as the maternal uterus and adnexal regions.

[Series 1: us ob comp less 14 wk · 15 of 26 slices shown]
[im 1/26]
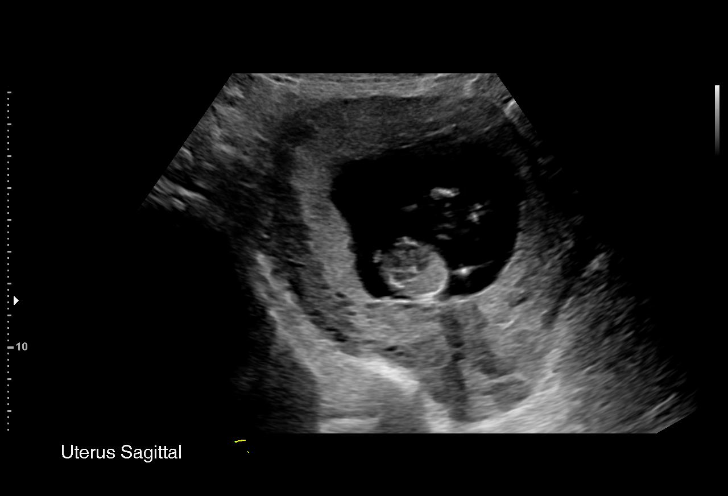
[im 3/26]
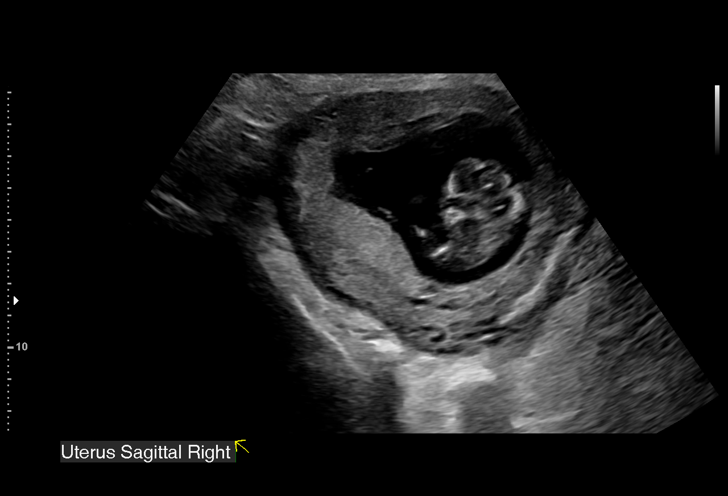
[im 5/26]
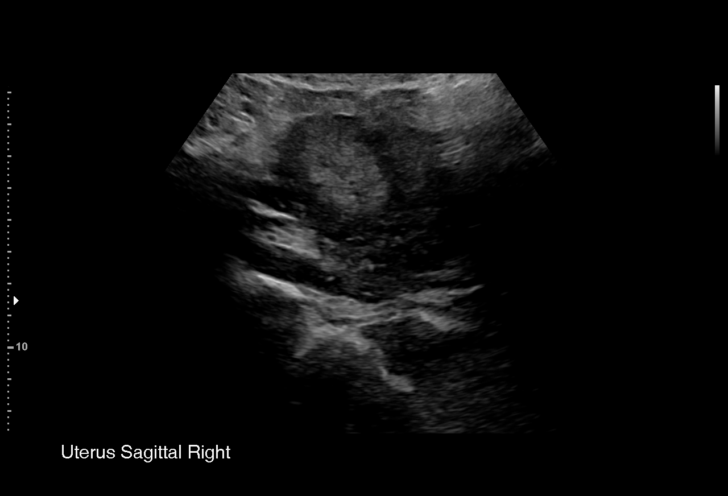
[im 7/26]
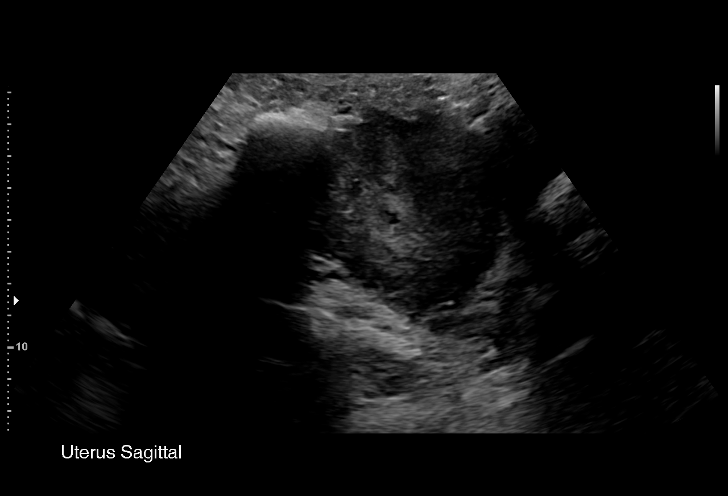
[im 8/26]
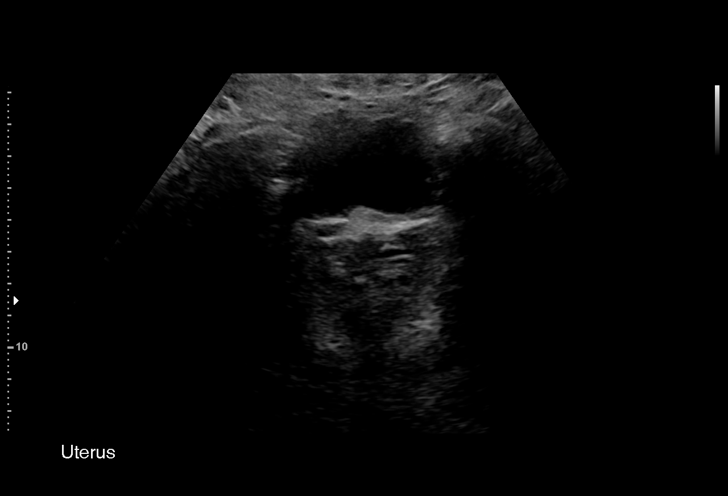
[im 10/26]
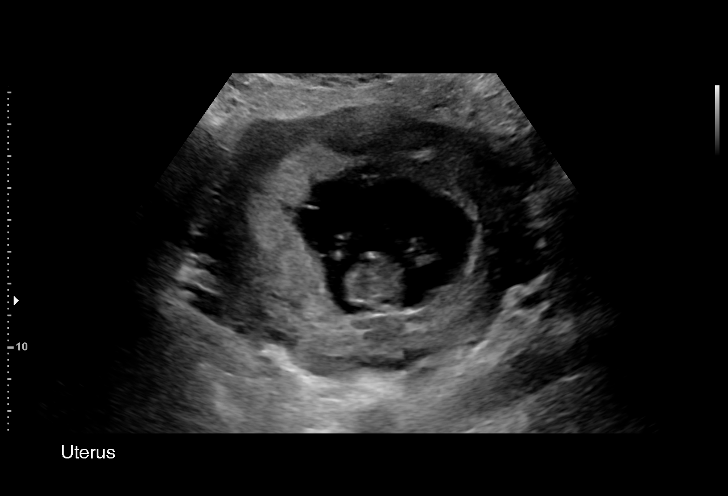
[im 12/26]
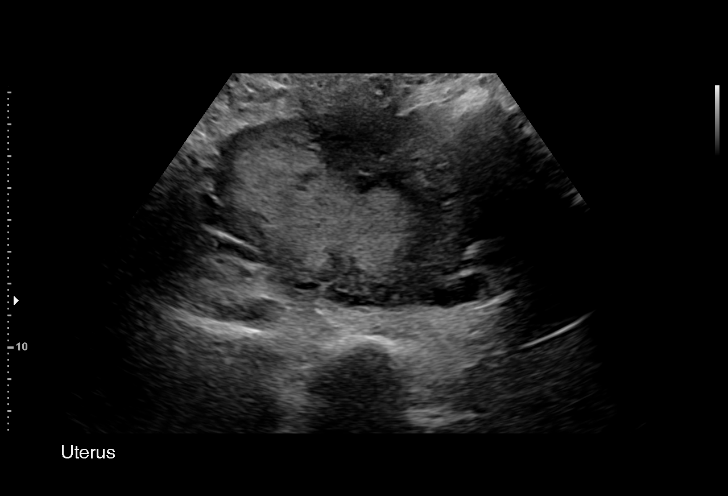
[im 14/26]
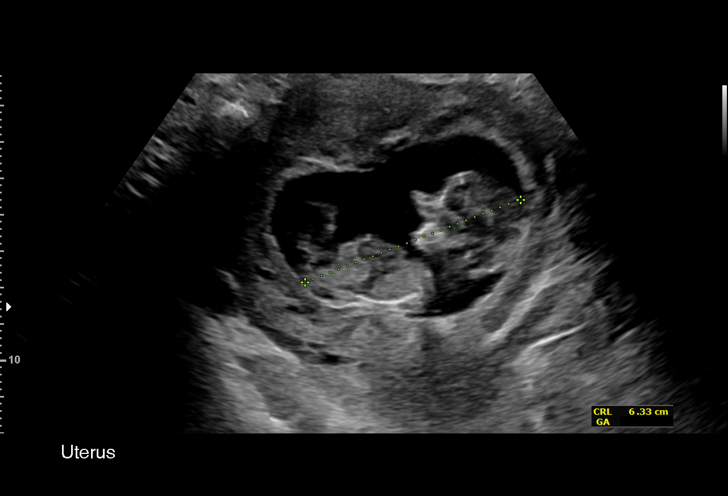
[im 15/26]
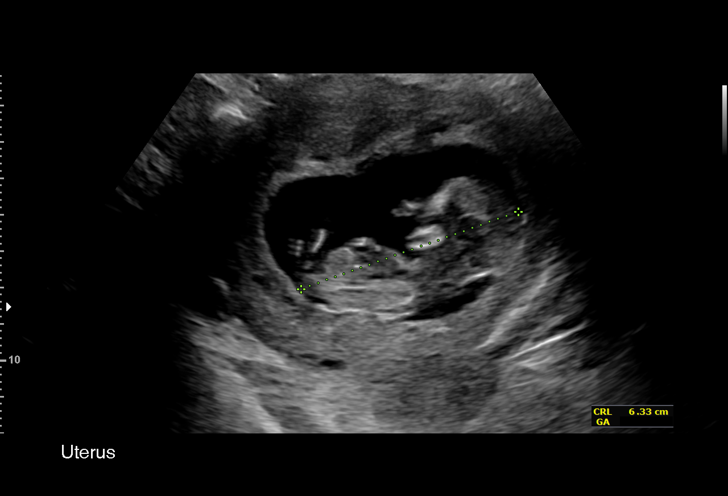
[im 17/26]
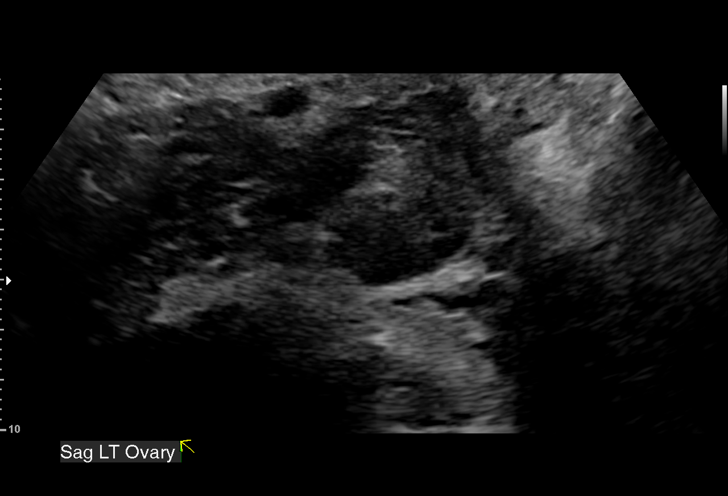
[im 19/26]
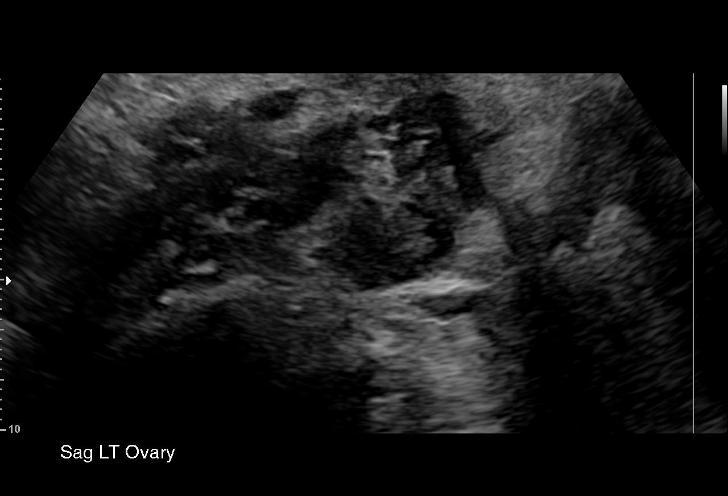
[im 20/26]
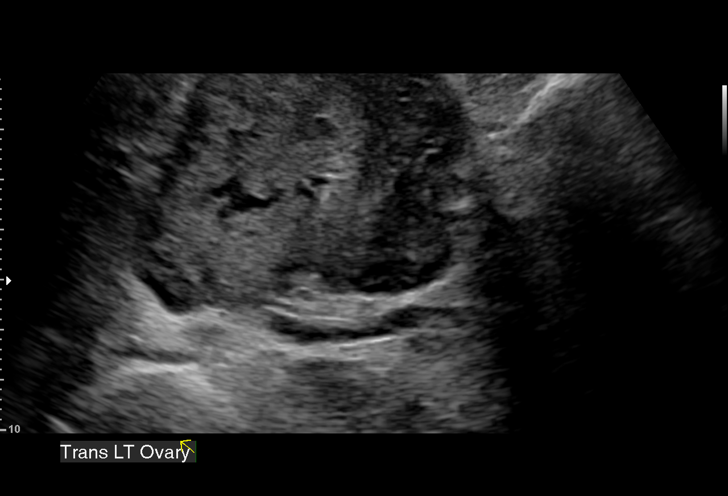
[im 22/26]
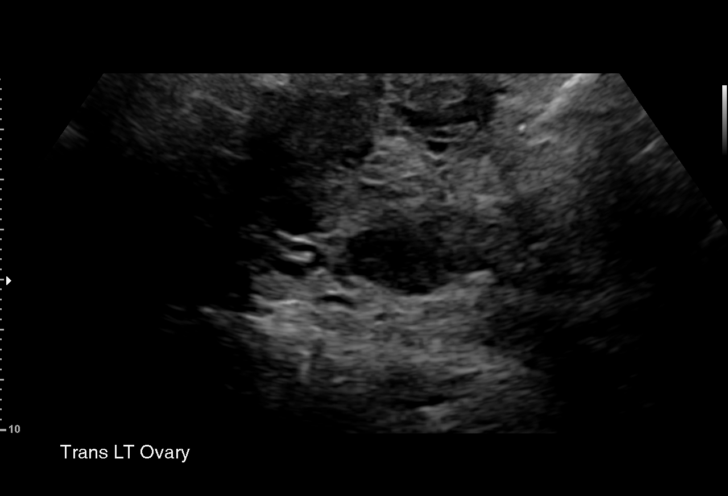
[im 24/26]
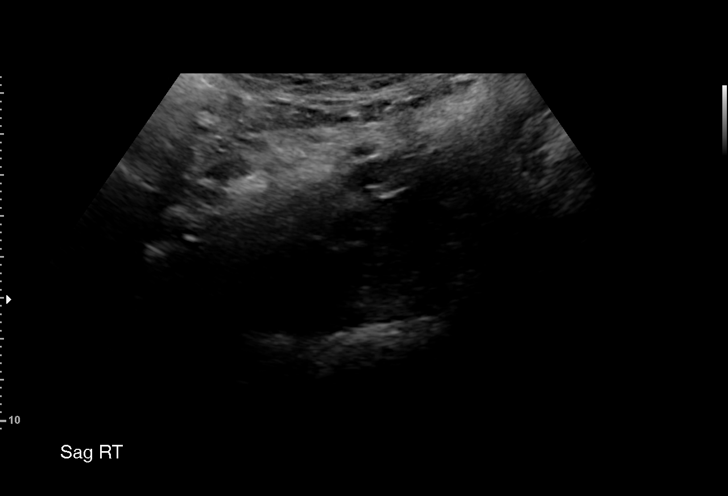
[im 26/26]
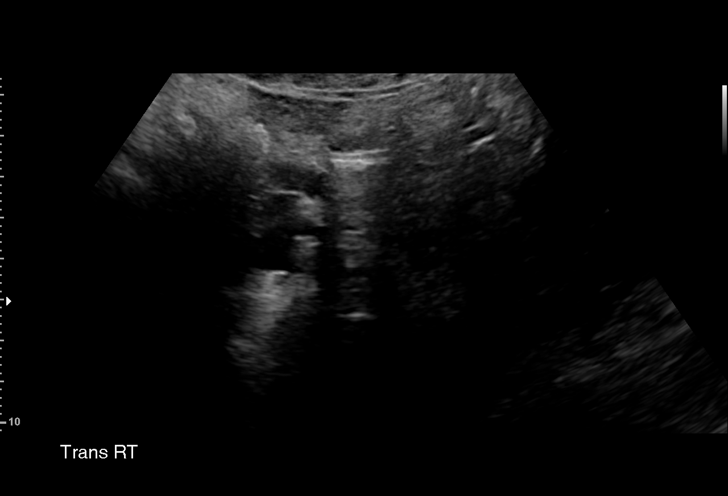

[15 of 26 positions shown; findings below may reference images not displayed]

FINDINGS: Intrauterine gestational sac: Present

Yolk sac:  Not present

Embryo:  Present

Cardiac Activity: Present

Heart Rate: 165 bpm

CRL:   6.4  mm   12 w 6 d                  US EDC: January 25, 2018

Subchorionic hemorrhage:  None visualized.

Maternal uterus/adnexae: Normal appearance of the LEFT adnexae,
RIGHT not sonographically identified. No free fluid.
IMPRESSION: Single live intrauterine pregnancy, gestational age by ultrasound 12
weeks and 6 days without immediate complication.

## 2019-08-03 ENCOUNTER — Other Ambulatory Visit: Payer: BLUE CROSS/BLUE SHIELD

## 2019-08-03 ENCOUNTER — Other Ambulatory Visit: Payer: Self-pay | Admitting: Internal Medicine

## 2019-08-03 DIAGNOSIS — R109 Unspecified abdominal pain: Secondary | ICD-10-CM

## 2019-08-09 ENCOUNTER — Ambulatory Visit
Admission: RE | Admit: 2019-08-09 | Discharge: 2019-08-09 | Disposition: A | Discharge status: Home / Self Care | Payer: PRIVATE HEALTH INSURANCE | Source: Ambulatory Visit | Attending: Internal Medicine | Admitting: Internal Medicine

## 2019-08-09 DIAGNOSIS — R109 Unspecified abdominal pain: Secondary | ICD-10-CM

## 2019-11-15 ENCOUNTER — Other Ambulatory Visit: Admission: RE | Admit: 2019-11-15 | Payer: PRIVATE HEALTH INSURANCE | Source: Ambulatory Visit | Admitting: *Deleted

## 2020-06-13 IMAGING — US US ABDOMEN COMPLETE
1 series · 14 of 25 positions shown · non-contrast
Comparison: None.

CLINICAL DATA: Right abdominal/flank region pain

EXAM:
ABDOMEN ULTRASOUND COMPLETE

[Series 1: us abdomen complete · 0.15mm/px · 14 of 99 slices shown]
[im 1/99]
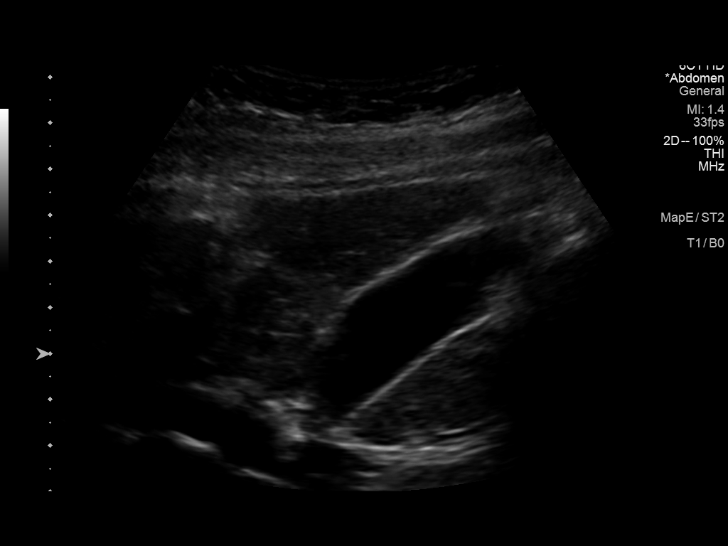
[im 9/99]
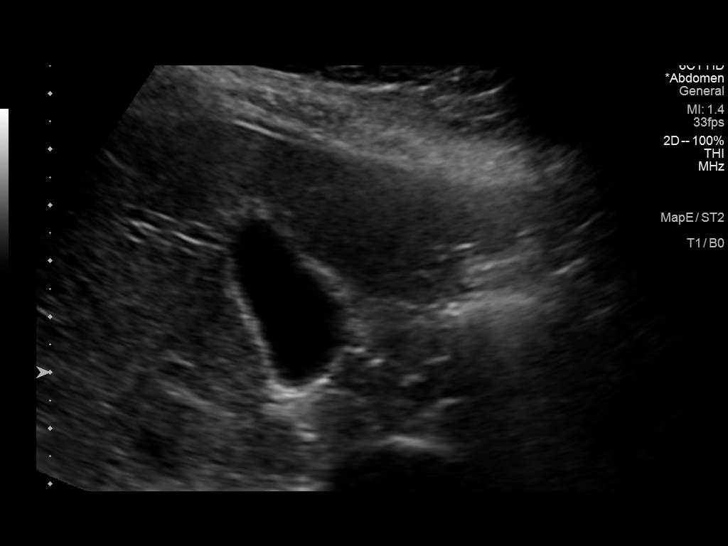
[im 17/99]
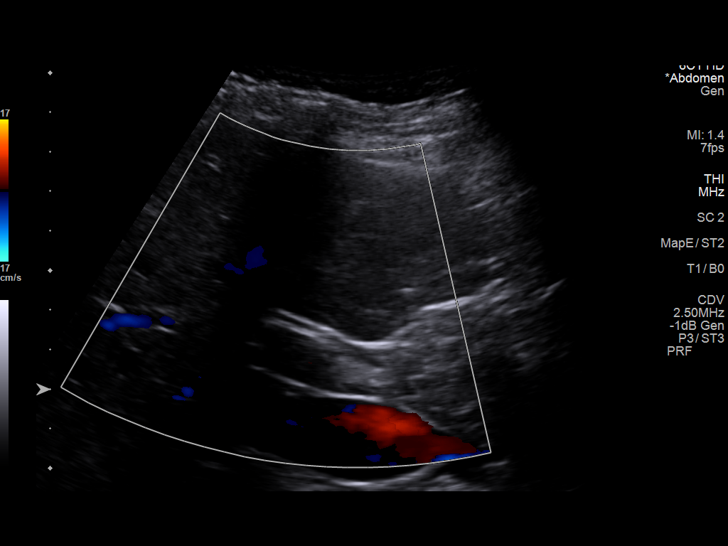
[im 25/99]
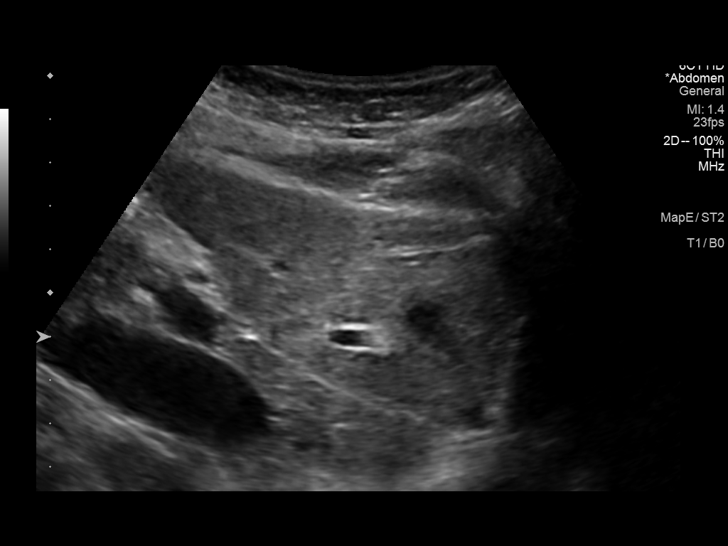
[im 33/99]
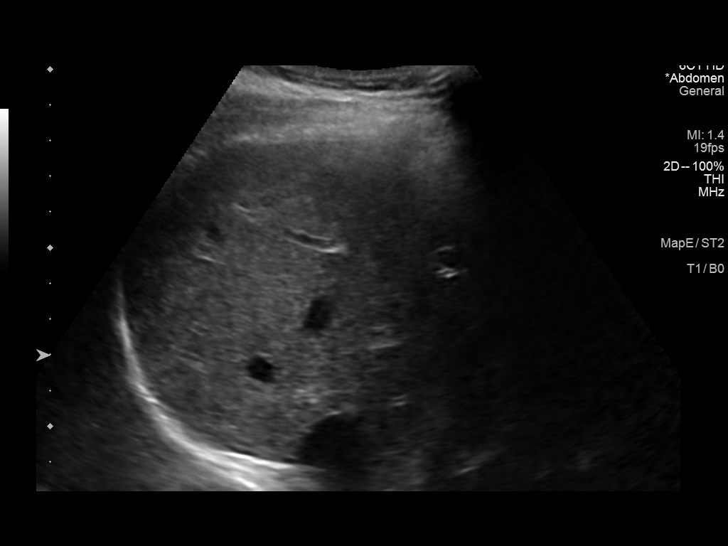
[im 37/99]
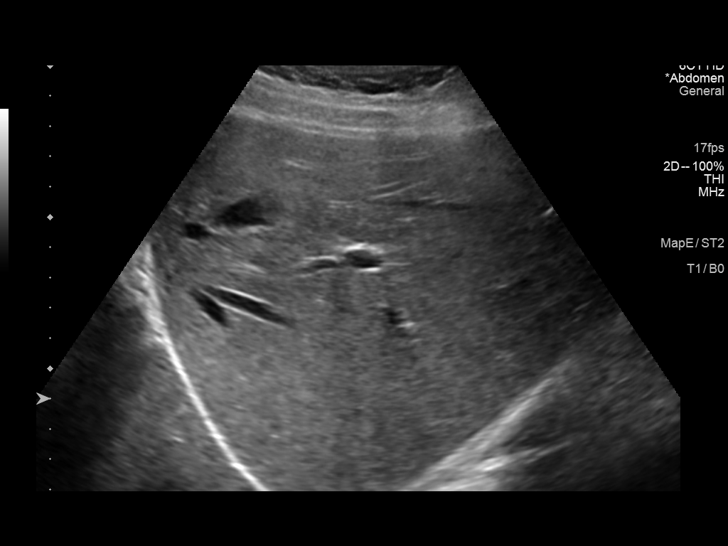
[im 45/99]
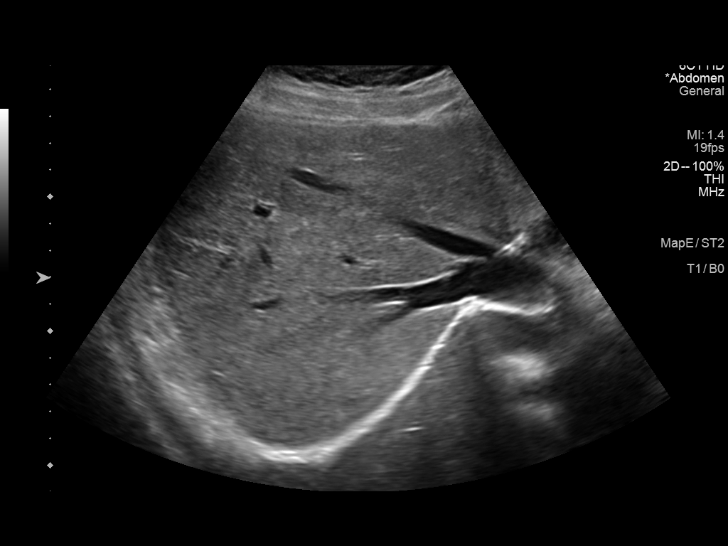
[im 54/99]
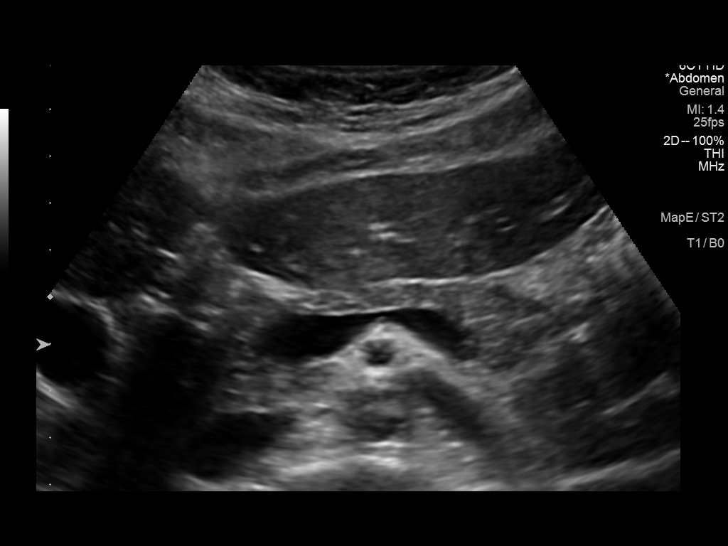
[im 62/99]
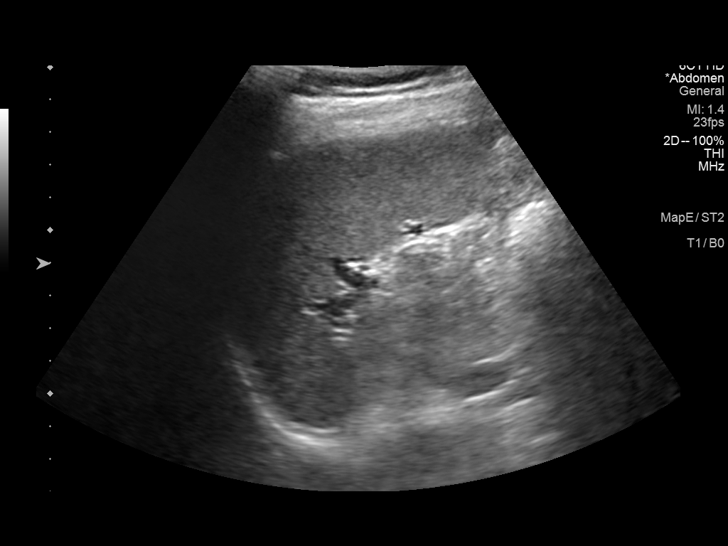
[im 66/99]
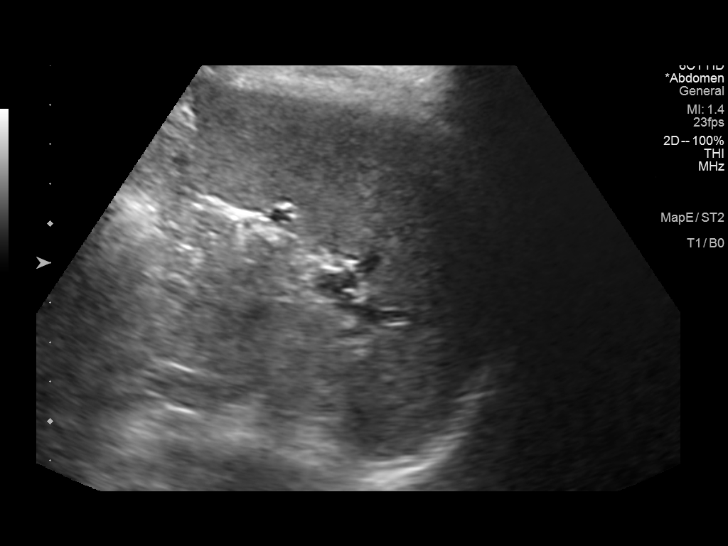
[im 74/99]
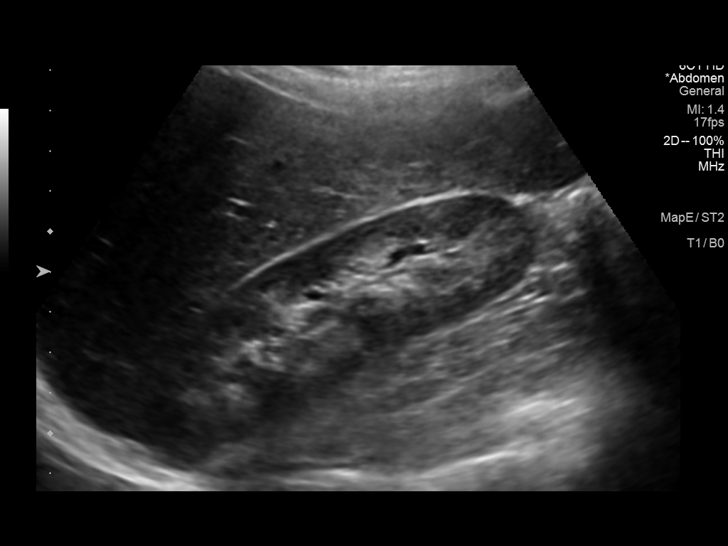
[im 82/99]
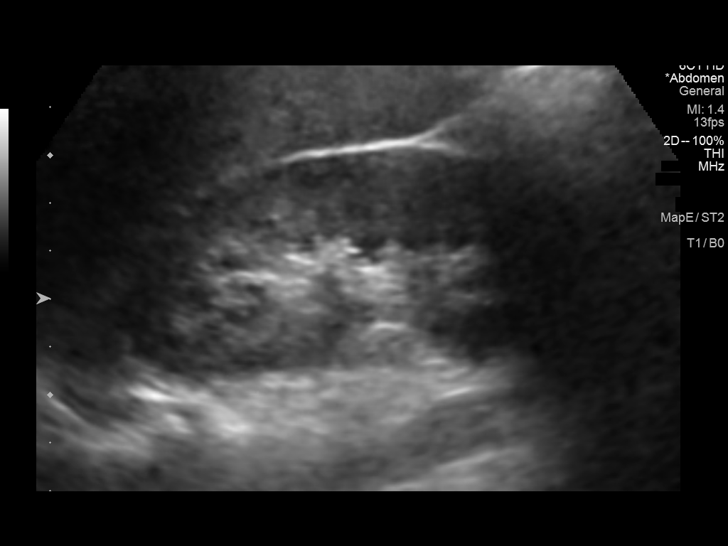
[im 90/99]
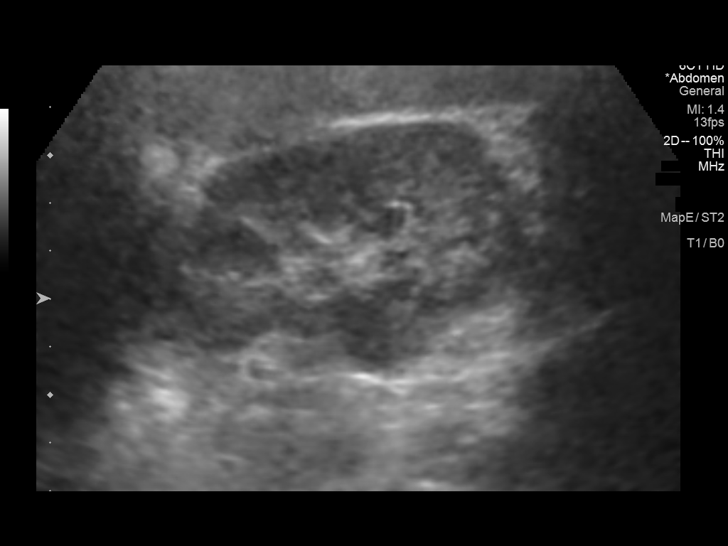
[im 99/99]
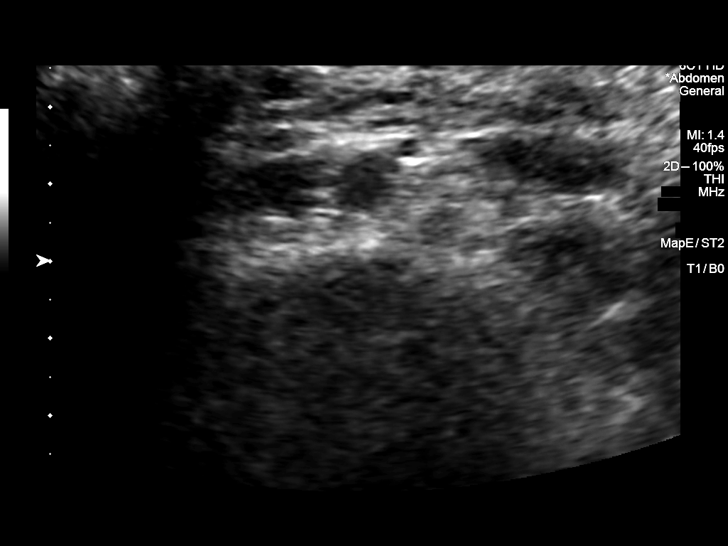

[14 of 25 positions shown; findings below may reference images not displayed]

FINDINGS: Gallbladder: No gallstones or wall thickening visualized. There is
no pericholecystic fluid. No sonographic Murphy sign noted by
sonographer.

Common bile duct: Diameter: 2 mm. No intrahepatic, common hepatic,
or common bile duct dilatation.

Liver: No focal lesion identified. Within normal limits in
parenchymal echogenicity. Portal vein is patent on color Doppler
imaging with normal direction of blood flow towards the liver.

IVC: No abnormality visualized.

Pancreas: No pancreatic mass or inflammatory focus.

Spleen: Size and appearance within normal limits.

Right Kidney: Length: 11.3 cm. Echogenicity within normal limits. No
mass or hydronephrosis visualized.

Left Kidney: Length: 9.8 cm. Echogenicity within normal limits. No
mass or hydronephrosis visualized.

Abdominal aorta: No aneurysm visualized.

Other findings: No demonstrable ascites.
IMPRESSION: Study within normal limits.

## 2020-07-20 LAB — OB RESULTS CONSOLE RUBELLA ANTIBODY, IGM: Rubella: IMMUNE

## 2020-07-20 LAB — OB RESULTS CONSOLE HIV ANTIBODY (ROUTINE TESTING): HIV: NONREACTIVE

## 2020-07-20 LAB — OB RESULTS CONSOLE HEPATITIS B SURFACE ANTIGEN: Hepatitis B Surface Ag: NEGATIVE

## 2020-08-24 ENCOUNTER — Other Ambulatory Visit: Payer: Self-pay | Admitting: Family

## 2020-08-24 ENCOUNTER — Telehealth: Payer: Self-pay | Admitting: Family

## 2020-08-24 DIAGNOSIS — U071 COVID-19: Secondary | ICD-10-CM

## 2020-08-24 NOTE — Telephone Encounter (Signed)
  I connected by phone with Brianna Robinson on 08/24/2020 at 10:52 AM to discuss the potential use of a new treatment for mild to moderate COVID-19 viral infection in non-hospitalized patients.  This patient is a 29 y.o. female that meets the FDA criteria for Emergency Use Authorization of COVID monoclonal antibody casirivimab/imdevimab, bamlanivimab/eteseviamb, or sotrovimab.  Has a (+) direct SARS-CoV-2 viral test result  Has mild or moderate COVID-19   Is NOT hospitalized due to COVID-19  Is within 10 days of symptom onset  Has at least one of the high risk factor(s) for progression to severe COVID-19 and/or hospitalization as defined in EUA.  Specific high risk criteria : Pregnancy and Chronic Lung Disease  Symptoms 08/21/20 Positive test 08/23/20  I have spoken and communicated the following to the patient or parent/caregiver regarding COVID monoclonal antibody treatment:  1. FDA has authorized the emergency use for the treatment of mild to moderate COVID-19 in adults and pediatric patients with positive results of direct SARS-CoV-2 viral testing who are 26 years of age and older weighing at least 40 kg, and who are at high risk for progressing to severe COVID-19 and/or hospitalization.  2. The significant known and potential risks and benefits of COVID monoclonal antibody, and the extent to which such potential risks and benefits are unknown.  3. Information on available alternative treatments and the risks and benefits of those alternatives, including clinical trials.  4. Patients treated with COVID monoclonal antibody should continue to self-isolate and use infection control measures (e.g., wear mask, isolate, social distance, avoid sharing personal items, clean and disinfect "high touch" surfaces, and frequent handwashing) according to CDC guidelines.   5. The patient or parent/caregiver has the option to accept or refuse COVID monoclonal antibody treatment.  After reviewing this  information with the patient, the patient has agreed to receive one of the available covid 19 monoclonal antibodies and will be provided an appropriate fact sheet prior to infusion. Alver Sorrow, NP 08/24/2020 10:52 AM

## 2020-08-25 ENCOUNTER — Ambulatory Visit (HOSPITAL_COMMUNITY)
Admission: RE | Admit: 2020-08-25 | Discharge: 2020-08-25 | Disposition: A | Discharge status: Home / Self Care | Payer: PRIVATE HEALTH INSURANCE | Source: Ambulatory Visit | Attending: Pulmonary Disease | Admitting: Pulmonary Disease

## 2020-08-25 DIAGNOSIS — U071 COVID-19: Secondary | ICD-10-CM | POA: Diagnosis not present

## 2020-08-25 MED ORDER — FAMOTIDINE IN NACL 20-0.9 MG/50ML-% IV SOLN: 20.0000 mg | Freq: Once | INTRAVENOUS | Status: DC | PRN

## 2020-08-25 MED ORDER — METHYLPREDNISOLONE SODIUM SUCC 125 MG IJ SOLR: 125.0000 mg | Freq: Once | INTRAMUSCULAR | Status: DC | PRN

## 2020-08-25 MED ORDER — ALBUTEROL SULFATE HFA 108 (90 BASE) MCG/ACT IN AERS: 2.0000 | INHALATION_SPRAY | Freq: Once | RESPIRATORY_TRACT | Status: DC | PRN

## 2020-08-25 MED ORDER — EPINEPHRINE 0.3 MG/0.3ML IJ SOAJ: 0.3000 mg | Freq: Once | INTRAMUSCULAR | Status: DC | PRN

## 2020-08-25 MED ORDER — SODIUM CHLORIDE 0.9 % IV SOLN: INTRAVENOUS | Status: DC | PRN

## 2020-08-25 MED ORDER — DIPHENHYDRAMINE HCL 50 MG/ML IJ SOLN: 50.0000 mg | Freq: Once | INTRAMUSCULAR | Status: DC | PRN

## 2020-08-25 MED ORDER — SOTROVIMAB 500 MG/8ML IV SOLN
500.0000 mg | Freq: Once | INTRAVENOUS | Status: AC
  Administered 2020-08-25: 500 mg via INTRAVENOUS

## 2020-08-25 NOTE — Discharge Instructions (Signed)
10 Things You Can Do to Manage Your COVID-19 Symptoms at Home If you have possible or confirmed COVID-19: 1. Stay home from work and school. And stay away from other public places. If you must go out, avoid using any kind of public transportation, ridesharing, or taxis. 2. Monitor your symptoms carefully. If your symptoms get worse, call your healthcare provider immediately. 3. Get rest and stay hydrated. 4. If you have a medical appointment, call the healthcare provider ahead of time and tell them that you have or may have COVID-19. 5. For medical emergencies, call 911 and notify the dispatch personnel that you have or may have COVID-19. 6. Cover your cough and sneezes with a tissue or use the inside of your elbow. 7. Wash your hands often with soap and water for at least 20 seconds or clean your hands with an alcohol-based hand sanitizer that contains at least 60% alcohol. 8. As much as possible, stay in a specific room and away from other people in your home. Also, you should use a separate bathroom, if available. If you need to be around other people in or outside of the home, wear a mask. 9. Avoid sharing personal items with other people in your household, like dishes, towels, and bedding. 10. Clean all surfaces that are touched often, like counters, tabletops, and doorknobs. Use household cleaning sprays or wipes according to the label instructions. cdc.gov/coronavirus 03/24/2019 This information is not intended to replace advice given to you by your health care provider. Make sure you discuss any questions you have with your health care provider. Document Revised: 08/26/2019 Document Reviewed: 08/26/2019 Elsevier Patient Education  2020 Elsevier Inc. What types of side effects do monoclonal antibody drugs cause?  Common side effects  In general, the more common side effects caused by monoclonal antibody drugs include: . Allergic reactions, such as hives or itching . Flu-like signs and  symptoms, including chills, fatigue, fever, and muscle aches and pains . Nausea, vomiting . Diarrhea . Skin rashes . Low blood pressure   The CDC is recommending patients who receive monoclonal antibody treatments wait at least 90 days before being vaccinated.  Currently, there are no data on the safety and efficacy of mRNA COVID-19 vaccines in persons who received monoclonal antibodies or convalescent plasma as part of COVID-19 treatment. Based on the estimated half-life of such therapies as well as evidence suggesting that reinfection is uncommon in the 90 days after initial infection, vaccination should be deferred for at least 90 days, as a precautionary measure until additional information becomes available, to avoid interference of the antibody treatment with vaccine-induced immune responses. If you have any questions or concerns after the infusion please call the Advanced Practice Provider on call at [phone]. This number is ONLY intended for your use regarding questions or concerns about the infusion post-treatment side-effects.  Please do not provide this number to others for use. For return to work notes please contact your primary care provider.   If someone you know is interested in receiving treatment please have them call the COVID hotline at [phone].   

## 2020-08-25 NOTE — Progress Notes (Signed)
Patient reviewed Fact Sheet for Patients, Parents, and Caregivers for Emergency Use Authorization (EUA) of Sotrovimab for the Treatment of Coronavirus. Patient also reviewed and is agreeable to the estimated cost of treatment. Patient is agreeable to proceed.   

## 2020-08-25 NOTE — Progress Notes (Signed)
Diagnosis: COVID-19  Physician: Dr. Patrick Wright  Procedure: Covid Infusion Clinic Med: Sotrovimab infusion - Provided patient with sotrovimab fact sheet for patients, parents, and caregivers prior to infusion.   Complications: No immediate complications noted  Discharge: Discharged home    

## 2020-09-23 NOTE — L&D Delivery Note (Addendum)
Delivery Note:   [id number] at [redacted]w[redacted]d  Admitting diagnosis: Pregnancy [Z34.90] Risks: SGA Infant d/t MCI  Previous c/s delivery  RhD negative  VBAC x1  Hx of Pre E with severe features at 37wks-on BBASA daily  First Stage:  Onset of labor: 01/31/21 @ 1130 Augmentation: N/A ROM: SROM @ 1425, Clear fluid  Active labor onset: 01/31/21 @ 1311 Analgesia /Anesthesia/Pain control intrapartum: None   Second Stage:  Complete dilation at 01/31/2021  @ 1311 Onset of pushing at 1313 FHR second stage: Intermittent Auscultation. FHR 140's with increases noted. No decreases.   SNM called to the room, Keiry was involuntarily baring down in the bathroom. CNM assisted back to bed. Guadalupe Pushing in hands and knees position with SNM  CNM and L&D staff support at bedside. With good maternal effort infant crownedup to +4 station. FOB, Rolm Gala, Mother of pt Andrey Campanile and Aundria Rud present for birth and supportive. Nuchal Cord: No  Delivery of a Live born female  Birth Weight: 6 lb 13.9 oz  APGAR: 8, 9  Newborn Delivery   Birth date/time: 01/31/2021 15:02:00 Delivery type: VBAC, Spontaneous     Fetal head delivered in Direct OA position with a compound posterior hand presentation. SNM gently grasped hand and released posterior arm. With maternal pushing efforts, the remaining fetal body delivered with ease. Infant was Passed through maternal legs by CNM and held S2S by mother. Infant vigorusly crying.     Cord double clamped after cessation of pulsation by SNM, and cut by FOB Stevi.  Collection of cord blood for typing completed. Cord blood donation-None  Arterial cord blood sample-No    Third Stage:  Placenta delivered spontaneously, with gentle cord traction and maternal pushing effort. Intact  with 3 vessels . Uterine tone firm bleeding minimal without clots. Uterotonics: none given Placenta to L&D for diposal.  Bilateral Hemostatic Labial  lacerations identified.  Episiotomy:None  Local  analgesia: 1% Lidocaine  Repair: Left labial laceration repaired with a 2.0 Vicryl in traditional usual fashion. Tissues well approximated.  Right Labial, hemostatic.  Est. Blood Loss (mL):150.00   Complications: None   Mom to postpartum.  Baby girl "Theora Gianotti" to Couplet care / Skin to Skin.  Delivery Report:  Review the Delivery Report for details.     Graycen Sadlon Danella Deis) Suzie Portela, BSN, RNC-OB  Student Nurse-Midwife   01/31/2021  6:35 PM

## 2021-01-09 LAB — OB RESULTS CONSOLE RPR: RPR: NONREACTIVE

## 2021-01-15 LAB — OB RESULTS CONSOLE GBS: GBS: NEGATIVE

## 2021-01-31 ENCOUNTER — Inpatient Hospital Stay (HOSPITAL_COMMUNITY)
Admission: AD | Admit: 2021-01-31 | Discharge: 2021-02-01 | DRG: 807 | Disposition: A | Discharge status: Home / Self Care | Payer: BC Managed Care – PPO | Attending: Obstetrics and Gynecology | Admitting: Obstetrics and Gynecology

## 2021-01-31 ENCOUNTER — Other Ambulatory Visit: Payer: Self-pay

## 2021-01-31 ENCOUNTER — Encounter (HOSPITAL_COMMUNITY): Payer: Self-pay

## 2021-01-31 DIAGNOSIS — Z98891 History of uterine scar from previous surgery: Secondary | ICD-10-CM

## 2021-01-31 DIAGNOSIS — O26893 Other specified pregnancy related conditions, third trimester: Secondary | ICD-10-CM | POA: Diagnosis present

## 2021-01-31 DIAGNOSIS — O26899 Other specified pregnancy related conditions, unspecified trimester: Secondary | ICD-10-CM

## 2021-01-31 DIAGNOSIS — Z3490 Encounter for supervision of normal pregnancy, unspecified, unspecified trimester: Secondary | ICD-10-CM

## 2021-01-31 DIAGNOSIS — Z3A39 39 weeks gestation of pregnancy: Secondary | ICD-10-CM | POA: Diagnosis not present

## 2021-01-31 DIAGNOSIS — Z20822 Contact with and (suspected) exposure to covid-19: Secondary | ICD-10-CM | POA: Diagnosis present

## 2021-01-31 DIAGNOSIS — Z6791 Unspecified blood type, Rh negative: Secondary | ICD-10-CM

## 2021-01-31 DIAGNOSIS — O322XX0 Maternal care for transverse and oblique lie, not applicable or unspecified: Secondary | ICD-10-CM | POA: Diagnosis present

## 2021-01-31 DIAGNOSIS — O43123 Velamentous insertion of umbilical cord, third trimester: Principal | ICD-10-CM | POA: Diagnosis present

## 2021-01-31 DIAGNOSIS — O34219 Maternal care for unspecified type scar from previous cesarean delivery: Secondary | ICD-10-CM | POA: Diagnosis not present

## 2021-01-31 LAB — CBC
HCT: 37.2 % (ref 36.0–46.0)
Hemoglobin: 13 g/dL (ref 12.0–15.0)
MCH: 32.6 pg (ref 26.0–34.0)
MCHC: 34.9 g/dL (ref 30.0–36.0)
MCV: 93.2 fL (ref 80.0–100.0)
Platelets: 199 10*3/uL (ref 150–400)
RBC: 3.99 MIL/uL (ref 3.87–5.11)
RDW: 11.8 % (ref 11.5–15.5)
WBC: 12 10*3/uL — ABNORMAL HIGH (ref 4.0–10.5)
nRBC: 0 % (ref 0.0–0.2)

## 2021-01-31 LAB — TYPE AND SCREEN
ABO/RH(D): A NEG
Antibody Screen: POSITIVE

## 2021-01-31 LAB — RESP PANEL BY RT-PCR (FLU A&B, COVID) ARPGX2
Influenza A by PCR: NEGATIVE
Influenza B by PCR: NEGATIVE
SARS Coronavirus 2 by RT PCR: NEGATIVE

## 2021-01-31 MED ORDER — IBUPROFEN 600 MG PO TABS
600.0000 mg | ORAL_TABLET | Freq: Four times a day (QID) | ORAL | Status: DC
  Administered 2021-01-31 – 2021-02-01 (×4): 600 mg via ORAL
  Filled 2021-01-31 (×4): qty 1

## 2021-01-31 MED ORDER — TERBUTALINE SULFATE 1 MG/ML IJ SOLN: 0.2500 mg | Freq: Once | INTRAMUSCULAR | Status: DC | PRN

## 2021-01-31 MED ORDER — PANTOPRAZOLE SODIUM 20 MG PO TBEC
20.0000 mg | DELAYED_RELEASE_TABLET | Freq: Every day | ORAL | Status: DC
  Filled 2021-01-31 (×2): qty 1

## 2021-01-31 MED ORDER — DIPHENHYDRAMINE HCL 25 MG PO CAPS: 25.0000 mg | ORAL_CAPSULE | Freq: Four times a day (QID) | ORAL | Status: DC | PRN

## 2021-01-31 MED ORDER — OXYTOCIN-SODIUM CHLORIDE 30-0.9 UT/500ML-% IV SOLN: 2.5000 [IU]/h | INTRAVENOUS | Status: DC

## 2021-01-31 MED ORDER — PRENATAL MULTIVITAMIN CH: 1.0000 | ORAL_TABLET | Freq: Every day | ORAL | Status: DC

## 2021-01-31 MED ORDER — ONDANSETRON HCL 4 MG/2ML IJ SOLN: 4.0000 mg | INTRAMUSCULAR | Status: DC | PRN

## 2021-01-31 MED ORDER — SOD CITRATE-CITRIC ACID 500-334 MG/5ML PO SOLN: 30.0000 mL | ORAL | Status: DC | PRN

## 2021-01-31 MED ORDER — LIDOCAINE HCL (PF) 1 % IJ SOLN
30.0000 mL | INTRAMUSCULAR | Status: AC | PRN
  Administered 2021-01-31: 30 mL via SUBCUTANEOUS
  Filled 2021-01-31: qty 30

## 2021-01-31 MED ORDER — ACETAMINOPHEN 325 MG PO TABS
650.0000 mg | ORAL_TABLET | ORAL | Status: DC | PRN
  Administered 2021-01-31 – 2021-02-01 (×2): 650 mg via ORAL
  Filled 2021-01-31 (×2): qty 2

## 2021-01-31 MED ORDER — LACTATED RINGERS IV SOLN
500.0000 mL | INTRAVENOUS | Status: DC | PRN
Start: 2021-01-31 — End: 2021-01-31

## 2021-01-31 MED ORDER — BENZOCAINE-MENTHOL 20-0.5 % EX AERO
1.0000 "application " | INHALATION_SPRAY | CUTANEOUS | Status: DC | PRN
  Administered 2021-01-31: 1 via TOPICAL
  Filled 2021-01-31: qty 56

## 2021-01-31 MED ORDER — COCONUT OIL OIL
1.0000 "application " | TOPICAL_OIL | Status: DC | PRN
  Administered 2021-01-31: 1 via TOPICAL

## 2021-01-31 MED ORDER — DIBUCAINE (PERIANAL) 1 % EX OINT: 1.0000 "application " | TOPICAL_OINTMENT | CUTANEOUS | Status: DC | PRN

## 2021-01-31 MED ORDER — ONDANSETRON HCL 4 MG PO TABS: 4.0000 mg | ORAL_TABLET | ORAL | Status: DC | PRN

## 2021-01-31 MED ORDER — OXYTOCIN BOLUS FROM INFUSION: 333.0000 mL | Freq: Once | INTRAVENOUS | Status: DC

## 2021-01-31 MED ORDER — WITCH HAZEL-GLYCERIN EX PADS: 1.0000 "application " | MEDICATED_PAD | CUTANEOUS | Status: DC | PRN

## 2021-01-31 MED ORDER — TETANUS-DIPHTH-ACELL PERTUSSIS 5-2.5-18.5 LF-MCG/0.5 IM SUSY: 0.5000 mL | PREFILLED_SYRINGE | Freq: Once | INTRAMUSCULAR | Status: DC

## 2021-01-31 MED ORDER — PRENATAL MULTIVITAMIN CH
1.0000 | ORAL_TABLET | Freq: Every day | ORAL | Status: DC
  Filled 2021-01-31: qty 1

## 2021-01-31 MED ORDER — SIMETHICONE 80 MG PO CHEW: 80.0000 mg | CHEWABLE_TABLET | ORAL | Status: DC | PRN

## 2021-01-31 MED ORDER — LACTATED RINGERS IV SOLN: INTRAVENOUS | Status: DC

## 2021-01-31 MED ORDER — OXYTOCIN-SODIUM CHLORIDE 30-0.9 UT/500ML-% IV SOLN: 1.0000 m[IU]/min | INTRAVENOUS | Status: DC

## 2021-01-31 MED ORDER — ACETAMINOPHEN 325 MG PO TABS: 650.0000 mg | ORAL_TABLET | ORAL | Status: DC | PRN

## 2021-01-31 MED ORDER — ZOLPIDEM TARTRATE 5 MG PO TABS: 5.0000 mg | ORAL_TABLET | Freq: Every evening | ORAL | Status: DC | PRN

## 2021-01-31 MED ORDER — ONDANSETRON HCL 4 MG/2ML IJ SOLN: 4.0000 mg | Freq: Four times a day (QID) | INTRAMUSCULAR | Status: DC | PRN

## 2021-01-31 MED ORDER — OXYCODONE-ACETAMINOPHEN 5-325 MG PO TABS: 2.0000 | ORAL_TABLET | ORAL | Status: DC | PRN

## 2021-01-31 MED ORDER — OXYCODONE-ACETAMINOPHEN 5-325 MG PO TABS: 1.0000 | ORAL_TABLET | ORAL | Status: DC | PRN

## 2021-01-31 MED ORDER — SENNOSIDES-DOCUSATE SODIUM 8.6-50 MG PO TABS
2.0000 | ORAL_TABLET | Freq: Every day | ORAL | Status: DC
  Administered 2021-02-01: 2 via ORAL
  Filled 2021-01-31: qty 2

## 2021-01-31 NOTE — H&P (Addendum)
OB ADMISSION/ HISTORY & PHYSICAL:  Admission Date: 01/31/2021 11:26 AM  Admit Diagnosis: Pregnancy [Z34.90]    Brianna Robinson is a 30 y.o. female presenting for Spontaneous onset of labor. Presented for labor check in office, SVE 5-6cm ,BBOW and bloody show. FHR in office 140's.  Direct admit to L&D. Aundria Rud, and FOB Stevi at bedside and supportive.   Prenatal History: [id number]   EDC : Not found.  Prenatal care at [facility] since 1st Trimester      Prenatal course complicated by: Previous c/s delivery  RhD negative  Marginal cord insertion of current pregnancy VBAC x1  Hx of Pre E with severe features at 37wks-on BBASA daily.   Prenatal Labs: ABO, Rh:   A Negative  Antibody: POS (05/11 1151)Negative  Rubella: Immune (10/28 0000)Immune   RPR: Nonreactive (04/19 0000) Non-reactive  HBsAg: Negative (10/28 0000) Non-reactive  HIV: Non-reactive (10/28 0000) Negative  GBS: Negative/-- (04/25 0000) Negative 01/15/21 1 hr Glucola : Normal 78gtts Genetic Screening:  Fetal Choroid Plexus Cyst- Resolved NIPTS-normal  Ultrasound: Marginal Cord Insertion. S<D. EFW 27% AC 39% AFI 9.4 Posterior placenta. Vertex @ 38wks **Surprise Baby**    Vaccines: TDaP          Declined                    COVID-19 ? Positive 08/23/20    Maternal Diabetes: No Genetic Screening: Normal  Maternal Ultrasounds/Referrals: Isolated choroid plexus cyst- Resolved  Fetal Ultrasounds or other Referrals:  None Maternal Substance Abuse:  No Significant Maternal Medications:  None Significant Maternal Lab Results:  Group B Strep negative and Rh negative  Other Comments:  None  Medical / Surgical History :  Past medical history:  Past Medical History:  Diagnosis Date  . Asthma   . Headache(784.0)   . HELLP (hemolytic anemia/elev liver enzymes/low platelets in pregnancy)   . HELLP (hemolytic anemia/elev liver enzymes/low platelets in pregnancy)   . Postpartum care following cesarean delivery (4/6) 12/28/2015   . Septate uterus      Past surgical history:  Past Surgical History:  Procedure Laterality Date  . CESAREAN SECTION N/A 12/28/2015   Procedure: CESAREAN SECTION;  Surgeon: Olivia Mackie, MD;  Location: [facility];  Service: Obstetrics;  Laterality: N/A;  . CYST EXCISION       Family History:  Family History  Problem Relation Age of Onset  . Ataxia Neg Hx   . Chorea Neg Hx   . Dementia Neg Hx   . Mental retardation Neg Hx   . Migraines Neg Hx   . Multiple sclerosis Neg Hx   . Neurofibromatosis Neg Hx   . Neuropathy Neg Hx   . Parkinsonism Neg Hx   . Seizures Neg Hx   . Stroke Neg Hx      Social History: no toxic habits   Allergies: Amoxicillin   Current Medications at time of admission:  Medications Prior to Admission  Medication Sig Dispense Refill Last Dose  . docusate sodium (COLACE) 100 MG capsule Take 1 capsule (100 mg total) by mouth 2 (two) times daily. 10 capsule 0   . pantoprazole (PROTONIX) 20 MG tablet Take 1 tablet by mouth daily.  3   . Prenatal Vit-Fe Fumarate-FA (PRENATAL MULTIVITAMIN) TABS tablet Take 1 tablet by mouth daily at 12 noon.        Review of Systems: Review of Systems  Constitutional: Negative for chills, fever and weight loss.  HENT: Negative for sore throat.  Eyes: Negative for blurred vision and double vision.  Respiratory: Negative for cough, shortness of breath, wheezing and stridor.   Cardiovascular: Negative for chest pain, palpitations and leg swelling.  Gastrointestinal: Negative for heartburn, nausea and vomiting.  Musculoskeletal: Negative for falls.  Neurological: Negative for dizziness, tingling, seizures, loss of consciousness, weakness and headaches.  Psychiatric/Behavioral: Negative for depression, hallucinations, substance abuse and suicidal ideas.    Physical Exam: Vital signs and nursing notes reviewed.  Patient Vitals for the past 24 hrs:  BP Temp Temp src Pulse Resp Height Weight  01/31/21 1726 111/71 97.7 F  (36.5 C) Axillary 63 17 -- --  01/31/21 1550 -- -- -- -- 18 -- --  01/31/21 1536 -- -- -- -- 18 -- --  01/31/21 1529 -- -- -- -- 18 -- --  01/31/21 1211 -- -- -- -- -- 5\' 2"  (1.575 m) 71.2 kg  01/31/21 1133 123/66 97.6 F (36.4 C) Oral 86 18 -- --     General: AAO x 3, NAD, coping well with contractions.  Heart: RRR Lungs:CTAB Abdomen: Gravid, NT, Leopold's Vertex Extremities: no edema present  Genitalia / VE: Dilation: 10 Dilation Complete Date: 01/31/21 Dilation Complete Time: 1445 Effacement (%): 90 Cervical Position: Middle Station: Plus 2 Presentation: Vertex Exam by:: [email or web address] CNM 5-6 cm Effacement: 90% Station: -1  D. Makaylie Dedeaux CNM   FHR: 135 BPM, moderate variability, present accels, absent decels TOCO: Ctx q 3-88mins   Labs:   Pending T&S, CBC, RPR  Recent Labs    01/31/21 1147  WBC 12.0*  HGB 13.0  HCT 37.2  PLT 199     Assessment:  29 y.o. 04/02/21 at 39wks 2 days spontaneously laboring.  Previous c/s delivery  RhD negative- Rhogam given at 30 wks.   -Plan for repeat Rhogam prior to discharge PRN.  Marginal cord insertion of current pregnancy VBAC x1  Hx of Pre E with severe features at 37wks-on BBASA daily.  1. TOLAC 2. FHR category 1- pt declines continuous monitoring   3. GBS negative  4. Desires unmedicated repeat VBAC.  5. Breastfeeding 6. Placenta disposal L&D   Plan:  1. Admit to BS 2. Routine L&D orders  -Pt refused Continuous FHR. Intermittent monitoring pending Admit tracing Cat I FHR.  3. Hydrotherapy as pt desires  4. Expectant management 5. Cautious for Repeat VBAC      Dr [id number] notified of admission / plan of care   Shantonette Conni Elliot) Danella Deis, BSN, RNC-OB  Student Nurse-Midwife   01/31/2021  6:07 PM  Medical screening examination/treatment/procedure(s) were conducted as a shared visit with non-physician practitioner(s) and myself.  I personally evaluated the patient during the encounter.   04/02/2021, CNM,  MSN 01/31/2021, 6:08 PM

## 2021-01-31 NOTE — Progress Notes (Signed)
Pt. Does not want continuing fetal monitoring or IV access. Pt. Educated about need to these interventions with history of c/s. Pt. Agreeable to intermittent monitoring and IV saline lock. Pt. Does not want baby medications after birth.

## 2021-01-31 NOTE — Lactation Note (Signed)
This note was copied from a baby's chart. Lactation Consultation Note  Patient Name: Brianna Robinson CVELF'Y Date: 01/31/2021 Reason for consult: L&D Initial assessment Age:31 hours  L&D consult with < 60 minutes old infant and P2 mother. Parents and grandmother present at time of consult. Congratulated them on their newborn. Infant is latched and active suckling/swallowing laid back position to left breast. Mother seems to be in a lot of pain at the moment and paid no attention to [facility]. Family has doula support in birthing suite.   No latch or hand expression assistance at this time. Explained LC services availability during postpartum stay. Thanked family for their time.    Maternal Data Has patient been taught Hand Expression?: No  Feeding Mother's Current Feeding Choice: Breast Milk  LATCH Score Latch: Grasps breast easily, tongue down, lips flanged, rhythmical sucking.  Audible Swallowing: Spontaneous and intermittent  Type of Nipple: Everted at rest and after stimulation  Comfort (Breast/Nipple): Soft / non-tender  Hold (Positioning): No assistance needed to correctly position infant at breast.  LATCH Score: 10  Interventions Interventions: Skin to skin;Expressed milk  Consult Status Consult Status: Follow-up Date: 02/01/21 Follow-up type: In-patient    [name] A Higuera Ancidey 01/31/2021, 3:59 PM

## 2021-02-01 LAB — CBC
HCT: 33.8 % — ABNORMAL LOW (ref 36.0–46.0)
Hemoglobin: 11.8 g/dL — ABNORMAL LOW (ref 12.0–15.0)
MCH: 32.6 pg (ref 26.0–34.0)
MCHC: 34.9 g/dL (ref 30.0–36.0)
MCV: 93.4 fL (ref 80.0–100.0)
Platelets: 211 10*3/uL (ref 150–400)
RBC: 3.62 MIL/uL — ABNORMAL LOW (ref 3.87–5.11)
RDW: 11.9 % (ref 11.5–15.5)
WBC: 13.6 10*3/uL — ABNORMAL HIGH (ref 4.0–10.5)
nRBC: 0 % (ref 0.0–0.2)

## 2021-02-01 LAB — RPR: RPR Ser Ql: NONREACTIVE

## 2021-02-01 LAB — BIRTH TISSUE RECOVERY COLLECTION (PLACENTA DONATION)

## 2021-02-01 MED ORDER — IBUPROFEN 600 MG PO TABS: 600.0000 mg | ORAL_TABLET | Freq: Four times a day (QID) | ORAL | 0 refills | Status: AC

## 2021-02-01 MED ORDER — RHO D IMMUNE GLOBULIN 1500 UNIT/2ML IJ SOSY
300.0000 ug | PREFILLED_SYRINGE | Freq: Once | INTRAMUSCULAR | Status: AC
  Administered 2021-02-01: 300 ug via INTRAVENOUS
  Filled 2021-02-01: qty 2

## 2021-02-01 MED ORDER — BENZOCAINE-MENTHOL 20-0.5 % EX AERO: 1.0000 "application " | INHALATION_SPRAY | CUTANEOUS | Status: AC | PRN

## 2021-02-01 MED ORDER — ACETAMINOPHEN 325 MG PO TABS: 650.0000 mg | ORAL_TABLET | ORAL | Status: AC | PRN

## 2021-02-01 MED ORDER — COCONUT OIL OIL: 1.0000 "application " | TOPICAL_OIL | 0 refills | Status: AC | PRN

## 2021-02-01 NOTE — Discharge Instructions (Signed)
Lactation outpatient support - home visit  Linda Coppola RN, MHA, IBCLC at [facility]: Lactation Consultant  [email or web address] Mail: [email or web address] Tel: [phone]    Additional resources:  [facility] [email or web address]   Chiropractic specialist   Dr. Leanna Hastings [email or web address]  Craniosacral therapy for baby  Erin Balkind  [email or web address]  

## 2021-02-01 NOTE — Discharge Summary (Signed)
OB Discharge Summary  Patient Name: Brianna Robinson DOB: 1990-09-25 MRN: [id number]  Date of admission: 01/31/2021 Delivering provider: Neta Mends   Admitting diagnosis: Pregnancy [Z34.90] Intrauterine pregnancy: [redacted]w[redacted]d     Secondary diagnosis: Patient Active Problem List   Diagnosis Date Noted  . Pregnancy / TOLAC 01/31/2021  . VBAC (vaginal birth after Cesarean) 01/27/2018  . Postpartum care following VBAC (5/11) 01/27/2018  . Third degree perineal laceration 01/27/2018  . Previous cesarean section 01/26/2018  . Rh negative status during pregnancy 01/26/2018   Additional problems:none  Date of discharge: 02/01/2021   Discharge diagnosis: Principal Problem:   Postpartum care following VBAC (5/11) Active Problems:   Previous cesarean section   Rh negative status during pregnancy   VBAC (vaginal birth after Cesarean)   Pregnancy / TOLAC                                                              Post partum procedures:rhogam given 02/01/21  Augmentation: N/A Pain control: None  Laceration:Labial  Episiotomy:None  Complications: None  Hospital course:  Onset of Labor With Vaginal Delivery      30 y.o. yo [phone] at [redacted]w[redacted]d was admitted in Active Labor on 01/31/2021. Patient had an uncomplicated labor course as follows:  Membrane Rupture Time/Date: 2:45 PM ,01/31/2021   Delivery Method:VBAC, Spontaneous  Episiotomy: None  Lacerations:  Labial  Patient had an uncomplicated postpartum course.  She is ambulating, tolerating a regular diet, passing flatus, and urinating well. Patient is discharged home in stable condition on 02/01/21.  Newborn Data: Birth date:01/31/2021  Birth time:3:02 PM  Gender:Female  Living status:Living  Apgars:8 ,9  Weight:3115 g   Physical exam  Vitals:   01/31/21 1830 01/31/21 2230 02/01/21 0230 02/01/21 0630  BP: 110/68 122/87 96/61 124/88  Pulse: 67 65 60 62  Resp: 17 17 16 17   Temp: 98 F (36.7 C) 98.1 F (36.7 C) 98.5 F (36.9 C) 98 F  (36.7 C)  TempSrc:  Oral Oral Oral  SpO2: 100% 100% 99% 100%  Weight:      Height:       General: alert, cooperative and no distress Lochia: minimal without clots  Uterine Fundus: firm, midline, tender to palpation  Incision: N/A Perineum: repair intact. no edema DVT Evaluation: minimal swelling, no signs of DVT.  Labs: Lab Results  Component Value Date   WBC 13.6 (H) 02/01/2021   HGB 11.8 (L) 02/01/2021   HCT 33.8 (L) 02/01/2021   MCV 93.4 02/01/2021   PLT 211 02/01/2021   CMP Latest Ref Rng & Units 01/26/2018  Glucose 65 - 99 mg/dL 97  BUN 6 - 20 mg/dL 10  Creatinine 03/28/2018 - 9.62 mg/dL 9.52  Sodium 8.41 - 324 mmol/L 134(L)  Potassium 3.5 - 5.1 mmol/L 5.1  Chloride 101 - 111 mmol/L 103  CO2 22 - 32 mmol/L 22  Calcium 8.9 - 10.3 mg/dL 9.0  Total Protein 6.5 - 8.1 g/dL 7.4  Total Bilirubin 0.3 - 1.2 mg/dL 401)  Alkaline Phos 38 - 126 U/L 166(H)  AST 15 - 41 U/L 21  ALT 14 - 54 U/L 16   Edinburgh Postnatal Depression Scale Screening Tool 01/28/2018  I have been able to laugh and see the funny side of things. 0  I have looked  forward with enjoyment to things. 0  I have blamed myself unnecessarily when things went wrong. 2  I have been anxious or worried for no good reason. 1  I have felt scared or panicky for no good reason. 0  Things have been getting on top of me. 1  I have been so unhappy that I have had difficulty sleeping. 0  I have felt sad or miserable. 0  I have been so unhappy that I have been crying. 0  The thought of harming myself has occurred to me. 0  Edinburgh Postnatal Depression Scale Total 4   Vaccines: TDaP          Declined                     COVID-19 Declined; Positive 08/2020  Discharge instruction:  per After Visit Summary,  [facility] booklet and  "Understanding Mother & Baby Care" hospital booklet  After Visit Meds:  Allergies as of 02/01/2021      Reactions   Amoxicillin Nausea And Vomiting   Has patient had a PCN reaction causing  immediate rash, facial/tongue/throat swelling, SOB or lightheadedness with hypotension: No Has patient had a PCN reaction causing severe rash involving mucus membranes or skin necrosis: No Has patient had a PCN reaction that required hospitalization No Has patient had a PCN reaction occurring within the last 10 years: No If all of the above answers are "NO", then may proceed with Cephalosporin use.      Medication List    TAKE these medications   acetaminophen 325 MG tablet Commonly known as: Tylenol Take 2 tablets (650 mg total) by mouth every 4 (four) hours as needed (for pain scale < 4).   benzocaine-Menthol 20-0.5 % Aero Commonly known as: DERMOPLAST Apply 1 application topically as needed for irritation (perineal discomfort).   coconut oil Oil Apply 1 application topically as needed.   docusate sodium 100 MG capsule Commonly known as: COLACE Take 1 capsule (100 mg total) by mouth 2 (two) times daily.   ibuprofen 600 MG tablet Commonly known as: ADVIL Take 1 tablet (600 mg total) by mouth every 6 (six) hours.   pantoprazole 20 MG tablet Commonly known as: PROTONIX Take 1 tablet by mouth daily.   prenatal multivitamin Tabs tablet Take 1 tablet by mouth daily at 12 noon.            Discharge Care Instructions  (From admission, onward)         Start     Ordered   02/01/21 0000  Discharge wound care:       Comments: Sitz baths 2 times /day with warm water x 1 week. May add herbals: 1 ounce dried comfrey leaf* 1 ounce calendula flowers 1 ounce lavender flowers  Supplies can be found online at [employer] sources at [employer], [employer]  1/2 ounce dried uva ursi leaves 1/2 ounce witch hazel blossoms (if you can find them) 1/2 ounce dried sage leaf 1/2 cup sea salt Directions: Bring 2 quarts of water to a boil. Turn off heat, and place 1 ounce (approximately 1 large handful) of the above mixed herbs (not the salt) into the pot. Steep, covered,  for 30 minutes.  Strain the liquid well with a fine mesh strainer, and discard the herb material. Add 2 quarts of liquid to the tub, along with the 1/2 cup of salt. This medicinal liquid can also be made into compresses and peri-rinses.  02/01/21 1554          Diet: routine diet  Activity: Advance as tolerated. Pelvic rest for 6 weeks.   Postpartum contraception: Vasectomy  Newborn Data: Live born female  Birth Weight: 6 lb 13.9 oz (3115 g) APGAR: 8, 9  Newborn Delivery   Birth date/time: 01/31/2021 15:02:00 Delivery type: VBAC, Spontaneous      named "Theora Gianotti"  Baby Feeding: Breast Disposition:home with mother  Delivery Report:   Review the Delivery Report for details.    Follow up:  Follow-up Information    Neta Mends, CNM. Call in 6 week(s).   Specialty: Obstetrics and Gynecology Contact information: [address] [place] [address] [phone]                 Signed: Warrick Parisian Danella Deis) Suzie Portela, BSN, RNC-OB  Student Nurse-Midwife   02/01/2021  4:23 PM

## 2021-02-02 LAB — RH IG WORKUP (INCLUDES ABO/RH)
Fetal Screen: NEGATIVE
Gestational Age(Wks): 39
Unit division: 0
